# Patient Record
Sex: Female | Born: 1935 | Race: White | Hispanic: No | Smoking: Never smoker
Health system: Southern US, Community
[De-identification: ages and names within clinical notes are randomized; demographics above are authoritative.]

## PROBLEM LIST (undated history)

## (undated) DIAGNOSIS — F32A Depression, unspecified: Secondary | ICD-10-CM

## (undated) DIAGNOSIS — K219 Gastro-esophageal reflux disease without esophagitis: Secondary | ICD-10-CM

## (undated) DIAGNOSIS — Z950 Presence of cardiac pacemaker: Secondary | ICD-10-CM

## (undated) DIAGNOSIS — E079 Disorder of thyroid, unspecified: Secondary | ICD-10-CM

## (undated) DIAGNOSIS — I10 Essential (primary) hypertension: Secondary | ICD-10-CM

## (undated) DIAGNOSIS — F329 Major depressive disorder, single episode, unspecified: Secondary | ICD-10-CM

## (undated) DIAGNOSIS — I2510 Atherosclerotic heart disease of native coronary artery without angina pectoris: Secondary | ICD-10-CM

## (undated) HISTORY — DX: Essential (primary) hypertension: I10

## (undated) HISTORY — DX: Major depressive disorder, single episode, unspecified: F32.9

## (undated) HISTORY — DX: Presence of cardiac pacemaker: Z95.0

## (undated) HISTORY — DX: Disorder of thyroid, unspecified: E07.9

## (undated) HISTORY — DX: Atherosclerotic heart disease of native coronary artery without angina pectoris: I25.10

## (undated) HISTORY — DX: Depression, unspecified: F32.A

## (undated) HISTORY — DX: Gastro-esophageal reflux disease without esophagitis: K21.9

---
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
## 1958-11-27 HISTORY — PX: ABDOMINAL HYSTERECTOMY: SHX81

## 1994-03-28 HISTORY — PX: CHOLECYSTECTOMY: SHX55

## 1996-03-28 DIAGNOSIS — I2510 Atherosclerotic heart disease of native coronary artery without angina pectoris: Secondary | ICD-10-CM

## 1996-03-28 HISTORY — DX: Atherosclerotic heart disease of native coronary artery without angina pectoris: I25.10

## 1996-03-28 HISTORY — PX: PACEMAKER PLACEMENT: SHX43

## 1996-03-28 HISTORY — PX: OTHER SURGICAL HISTORY: SHX169

## 2004-01-15 ENCOUNTER — Emergency Department: Payer: Self-pay | Admitting: Emergency Medicine

## 2004-01-23 ENCOUNTER — Ambulatory Visit: Payer: Self-pay | Admitting: Internal Medicine

## 2004-02-06 ENCOUNTER — Ambulatory Visit: Payer: Self-pay | Admitting: Internal Medicine

## 2004-02-25 ENCOUNTER — Ambulatory Visit: Payer: Self-pay | Admitting: Neurology

## 2004-12-05 ENCOUNTER — Emergency Department: Payer: Self-pay | Admitting: Unknown Physician Specialty

## 2005-02-04 ENCOUNTER — Ambulatory Visit: Payer: Self-pay | Admitting: Internal Medicine

## 2005-03-02 ENCOUNTER — Ambulatory Visit: Payer: Self-pay | Admitting: Internal Medicine

## 2005-03-02 DIAGNOSIS — K219 Gastro-esophageal reflux disease without esophagitis: Secondary | ICD-10-CM

## 2005-04-14 ENCOUNTER — Ambulatory Visit: Payer: Self-pay | Admitting: *Deleted

## 2005-04-28 HISTORY — PX: US ECHOCARDIOGRAPHY: HXRAD669

## 2005-05-04 ENCOUNTER — Ambulatory Visit: Payer: Self-pay | Admitting: Internal Medicine

## 2005-05-12 ENCOUNTER — Encounter: Payer: Self-pay | Admitting: Internal Medicine

## 2005-05-26 ENCOUNTER — Ambulatory Visit: Payer: Self-pay | Admitting: *Deleted

## 2005-06-01 ENCOUNTER — Ambulatory Visit: Payer: Self-pay | Admitting: *Deleted

## 2005-06-07 ENCOUNTER — Ambulatory Visit: Payer: Self-pay | Admitting: Internal Medicine

## 2005-08-11 ENCOUNTER — Ambulatory Visit: Payer: Self-pay | Admitting: *Deleted

## 2005-08-25 ENCOUNTER — Ambulatory Visit: Payer: Self-pay | Admitting: *Deleted

## 2005-10-13 ENCOUNTER — Ambulatory Visit: Payer: Self-pay | Admitting: *Deleted

## 2005-12-15 ENCOUNTER — Ambulatory Visit: Payer: Self-pay | Admitting: *Deleted

## 2006-01-06 ENCOUNTER — Ambulatory Visit: Payer: Self-pay | Admitting: *Deleted

## 2006-02-07 ENCOUNTER — Ambulatory Visit: Payer: Self-pay | Admitting: Ophthalmology

## 2006-02-10 ENCOUNTER — Ambulatory Visit: Payer: Self-pay | Admitting: Internal Medicine

## 2006-02-14 ENCOUNTER — Ambulatory Visit: Payer: Self-pay | Admitting: Ophthalmology

## 2006-02-14 HISTORY — PX: CATARACT EXTRACTION: SUR2

## 2006-02-15 ENCOUNTER — Ambulatory Visit: Payer: Self-pay | Admitting: *Deleted

## 2006-04-17 ENCOUNTER — Ambulatory Visit: Payer: Self-pay | Admitting: *Deleted

## 2006-06-15 ENCOUNTER — Ambulatory Visit: Payer: Self-pay | Admitting: *Deleted

## 2006-07-05 ENCOUNTER — Encounter: Payer: Self-pay | Admitting: *Deleted

## 2006-07-05 DIAGNOSIS — I2510 Atherosclerotic heart disease of native coronary artery without angina pectoris: Secondary | ICD-10-CM | POA: Insufficient documentation

## 2006-07-05 DIAGNOSIS — F329 Major depressive disorder, single episode, unspecified: Secondary | ICD-10-CM

## 2006-07-05 DIAGNOSIS — D649 Anemia, unspecified: Secondary | ICD-10-CM

## 2006-07-05 DIAGNOSIS — E0590 Thyrotoxicosis, unspecified without thyrotoxic crisis or storm: Secondary | ICD-10-CM | POA: Insufficient documentation

## 2006-07-05 DIAGNOSIS — E119 Type 2 diabetes mellitus without complications: Secondary | ICD-10-CM

## 2006-07-05 DIAGNOSIS — I10 Essential (primary) hypertension: Secondary | ICD-10-CM | POA: Insufficient documentation

## 2006-07-05 DIAGNOSIS — M810 Age-related osteoporosis without current pathological fracture: Secondary | ICD-10-CM | POA: Insufficient documentation

## 2006-08-10 ENCOUNTER — Ambulatory Visit: Payer: Self-pay | Admitting: *Deleted

## 2006-08-11 ENCOUNTER — Encounter (INDEPENDENT_AMBULATORY_CARE_PROVIDER_SITE_OTHER): Payer: Self-pay | Admitting: *Deleted

## 2006-08-11 DIAGNOSIS — H04129 Dry eye syndrome of unspecified lacrimal gland: Secondary | ICD-10-CM | POA: Insufficient documentation

## 2006-10-13 ENCOUNTER — Ambulatory Visit: Payer: Self-pay | Admitting: *Deleted

## 2006-11-10 ENCOUNTER — Encounter (INDEPENDENT_AMBULATORY_CARE_PROVIDER_SITE_OTHER): Payer: Self-pay | Admitting: *Deleted

## 2006-11-16 ENCOUNTER — Encounter: Payer: Self-pay | Admitting: Internal Medicine

## 2006-11-22 ENCOUNTER — Telehealth (INDEPENDENT_AMBULATORY_CARE_PROVIDER_SITE_OTHER): Payer: Self-pay | Admitting: *Deleted

## 2006-11-23 ENCOUNTER — Ambulatory Visit: Payer: Self-pay | Admitting: *Deleted

## 2006-12-06 ENCOUNTER — Ambulatory Visit: Payer: Self-pay | Admitting: Internal Medicine

## 2006-12-06 DIAGNOSIS — J019 Acute sinusitis, unspecified: Secondary | ICD-10-CM

## 2006-12-12 ENCOUNTER — Encounter (INDEPENDENT_AMBULATORY_CARE_PROVIDER_SITE_OTHER): Payer: Self-pay | Admitting: *Deleted

## 2006-12-19 ENCOUNTER — Encounter (INDEPENDENT_AMBULATORY_CARE_PROVIDER_SITE_OTHER): Payer: Self-pay | Admitting: *Deleted

## 2007-01-19 ENCOUNTER — Encounter: Payer: Self-pay | Admitting: Internal Medicine

## 2007-02-27 ENCOUNTER — Encounter (INDEPENDENT_AMBULATORY_CARE_PROVIDER_SITE_OTHER): Payer: Self-pay | Admitting: *Deleted

## 2007-04-13 ENCOUNTER — Encounter (INDEPENDENT_AMBULATORY_CARE_PROVIDER_SITE_OTHER): Payer: Self-pay | Admitting: *Deleted

## 2007-04-13 ENCOUNTER — Ambulatory Visit: Payer: Self-pay | Admitting: *Deleted

## 2007-04-13 ENCOUNTER — Encounter: Payer: Self-pay | Admitting: *Deleted

## 2007-04-13 DIAGNOSIS — G4700 Insomnia, unspecified: Secondary | ICD-10-CM

## 2007-04-18 ENCOUNTER — Encounter (INDEPENDENT_AMBULATORY_CARE_PROVIDER_SITE_OTHER): Payer: Self-pay | Admitting: *Deleted

## 2007-04-30 ENCOUNTER — Ambulatory Visit: Payer: Self-pay | Admitting: Internal Medicine

## 2007-05-10 ENCOUNTER — Ambulatory Visit: Payer: Self-pay | Admitting: *Deleted

## 2007-06-25 ENCOUNTER — Encounter (INDEPENDENT_AMBULATORY_CARE_PROVIDER_SITE_OTHER): Payer: Self-pay | Admitting: *Deleted

## 2007-08-03 ENCOUNTER — Encounter (INDEPENDENT_AMBULATORY_CARE_PROVIDER_SITE_OTHER): Payer: Self-pay | Admitting: *Deleted

## 2007-08-07 ENCOUNTER — Encounter: Payer: Self-pay | Admitting: Internal Medicine

## 2007-08-10 ENCOUNTER — Ambulatory Visit: Payer: Self-pay | Admitting: *Deleted

## 2007-10-17 ENCOUNTER — Encounter: Payer: Self-pay | Admitting: Internal Medicine

## 2007-12-13 ENCOUNTER — Telehealth: Payer: Self-pay | Admitting: Internal Medicine

## 2007-12-13 ENCOUNTER — Encounter: Payer: Self-pay | Admitting: Internal Medicine

## 2007-12-13 ENCOUNTER — Ambulatory Visit: Payer: Self-pay | Admitting: Internal Medicine

## 2007-12-13 DIAGNOSIS — J309 Allergic rhinitis, unspecified: Secondary | ICD-10-CM | POA: Insufficient documentation

## 2007-12-19 ENCOUNTER — Encounter: Payer: Self-pay | Admitting: Internal Medicine

## 2008-01-14 ENCOUNTER — Ambulatory Visit: Payer: Self-pay | Admitting: Internal Medicine

## 2008-01-24 ENCOUNTER — Encounter: Payer: Self-pay | Admitting: Internal Medicine

## 2008-02-18 ENCOUNTER — Ambulatory Visit: Payer: Self-pay | Admitting: Internal Medicine

## 2008-02-22 ENCOUNTER — Ambulatory Visit: Payer: Self-pay | Admitting: Family Medicine

## 2008-02-25 ENCOUNTER — Telehealth (INDEPENDENT_AMBULATORY_CARE_PROVIDER_SITE_OTHER): Payer: Self-pay | Admitting: *Deleted

## 2008-02-28 ENCOUNTER — Telehealth: Payer: Self-pay | Admitting: Internal Medicine

## 2008-03-24 ENCOUNTER — Encounter: Payer: Self-pay | Admitting: Internal Medicine

## 2008-04-24 ENCOUNTER — Encounter: Payer: Self-pay | Admitting: Internal Medicine

## 2008-04-24 ENCOUNTER — Ambulatory Visit: Payer: Self-pay | Admitting: Internal Medicine

## 2008-05-30 ENCOUNTER — Encounter: Payer: Self-pay | Admitting: Internal Medicine

## 2008-06-13 ENCOUNTER — Encounter: Payer: Self-pay | Admitting: Internal Medicine

## 2008-06-19 ENCOUNTER — Telehealth: Payer: Self-pay | Admitting: Internal Medicine

## 2008-07-04 ENCOUNTER — Encounter: Payer: Self-pay | Admitting: Internal Medicine

## 2008-07-14 ENCOUNTER — Telehealth: Payer: Self-pay | Admitting: Internal Medicine

## 2008-07-21 ENCOUNTER — Ambulatory Visit: Payer: Self-pay | Admitting: Internal Medicine

## 2008-07-21 DIAGNOSIS — L02419 Cutaneous abscess of limb, unspecified: Secondary | ICD-10-CM

## 2008-07-21 DIAGNOSIS — L03119 Cellulitis of unspecified part of limb: Secondary | ICD-10-CM

## 2008-08-11 ENCOUNTER — Telehealth: Payer: Self-pay | Admitting: Internal Medicine

## 2008-08-12 ENCOUNTER — Ambulatory Visit: Payer: Self-pay | Admitting: Family Medicine

## 2008-08-15 ENCOUNTER — Telehealth (INDEPENDENT_AMBULATORY_CARE_PROVIDER_SITE_OTHER): Payer: Self-pay | Admitting: *Deleted

## 2008-09-03 ENCOUNTER — Encounter: Payer: Self-pay | Admitting: Internal Medicine

## 2008-09-03 ENCOUNTER — Ambulatory Visit: Payer: Self-pay | Admitting: Internal Medicine

## 2008-09-22 ENCOUNTER — Encounter: Payer: Self-pay | Admitting: Family Medicine

## 2009-02-13 ENCOUNTER — Telehealth: Payer: Self-pay | Admitting: Internal Medicine

## 2009-10-16 ENCOUNTER — Encounter (INDEPENDENT_AMBULATORY_CARE_PROVIDER_SITE_OTHER): Payer: Self-pay | Admitting: *Deleted

## 2009-11-16 ENCOUNTER — Encounter (INDEPENDENT_AMBULATORY_CARE_PROVIDER_SITE_OTHER): Payer: Self-pay | Admitting: *Deleted

## 2009-12-24 ENCOUNTER — Encounter: Payer: Self-pay | Admitting: Internal Medicine

## 2010-01-12 ENCOUNTER — Ambulatory Visit: Payer: Self-pay | Admitting: Internal Medicine

## 2010-01-12 DIAGNOSIS — Z950 Presence of cardiac pacemaker: Secondary | ICD-10-CM

## 2010-01-25 ENCOUNTER — Encounter: Payer: Self-pay | Admitting: Internal Medicine

## 2010-01-26 ENCOUNTER — Ambulatory Visit: Payer: Self-pay | Admitting: Internal Medicine

## 2010-01-26 ENCOUNTER — Ambulatory Visit (HOSPITAL_COMMUNITY): Admission: RE | Admit: 2010-01-26 | Discharge: 2010-01-26 | Payer: Self-pay | Admitting: Internal Medicine

## 2010-01-26 DIAGNOSIS — Z950 Presence of cardiac pacemaker: Secondary | ICD-10-CM

## 2010-01-26 HISTORY — PX: INSERT / REPLACE / REMOVE PACEMAKER: SUR710

## 2010-01-26 HISTORY — DX: Presence of cardiac pacemaker: Z95.0

## 2010-02-04 ENCOUNTER — Encounter: Payer: Self-pay | Admitting: Internal Medicine

## 2010-02-12 ENCOUNTER — Encounter (INDEPENDENT_AMBULATORY_CARE_PROVIDER_SITE_OTHER): Payer: Self-pay | Admitting: *Deleted

## 2010-04-19 ENCOUNTER — Ambulatory Visit: Admit: 2010-04-19 | Payer: Self-pay | Admitting: Internal Medicine

## 2010-04-27 ENCOUNTER — Encounter: Payer: Self-pay | Admitting: Internal Medicine

## 2010-04-27 ENCOUNTER — Ambulatory Visit: Admit: 2010-04-27 | Payer: Self-pay | Admitting: Internal Medicine

## 2010-04-27 NOTE — Miscellaneous (Signed)
Summary: Device change out  Clinical Lists Changes  Observations: Added new observation of PPMLEADSTAT2: active (02/04/2010 7:12) Added new observation of PPMLEADSTAT1: active (02/04/2010 7:12) Added new observation of PPM IMP MD: Lewayne Bunting, MD (02/04/2010 7:12) Added new observation of PPM DOI: 01/26/2010 (02/04/2010 7:12) Added new observation of PPM SERL#: [id number] (02/04/2010 7:12) Added new observation of PPM MODL#: [id number] (02/04/2010 7:12) Added new observation of MAGNET RTE: BOL 85 ERI 65 (02/04/2010 7:12) Added new observation of PPMEXPLCOMM: 01/26/10 Medtronic 7862 [id number] H explanted (02/04/2010 7:12)      PPM Specifications Following MD:  Sherryl Manges, MD     PPM Vendor:  Medtronic     PPM Model Number:  [id number]     PPM Serial Number:  [id number] PPM DOI:  01/26/2010     PPM Implanting MD:  Lewayne Bunting, MD  Lead 1    Location: RA     DOI: 05/01/1996     Model #: [id number]     Serial #: [id number]     Status: active Lead 2    Location: RV     DOI: 05/01/1996     Model #: [id number]     Serial #: [id number]     Status: active  Magnet Response Rate:  BOL 85 ERI 65  Indications:  COMPLETE HEART BLOCK  Explantation Comments:  01/26/10 Medtronic 7862 [id number] H explanted  PPM Follow Up Pacer Dependent:  No      Episodes Coumadin:  No  Parameters Mode:  DDD     Lower Rate Limit:  50     Upper Rate Limit:  110 Paced AV Delay:  220     Sensed AV Delay:  220

## 2010-04-27 NOTE — Assessment & Plan Note (Signed)
Summary: ROV/ALT      Allergies Added: NKDA  Visit Type:  Follow-up Primary Provider:  Jarold Motto  CC:  c/o a rash and swelling around the pacemaker.Marland Kitchen  History of Present Illness: Joanna Young returns today after a long absence from our PPM clinic.  She is a pleasant elderly woman with a h/o bradycardia who is s/p PPM in 1998.  She has done well and denies syncope or peripheral edema.  No c/p or sob.  She has a h/o moderate LV dysfunction but no significant CHF symptoms.  She c/o a rash around her PPM. No fever/chills/night sweats.  Current Medications (verified): 1)  Prilosec 20 Mg  Cpdr (Omeprazole) .... Take One Daily Before Breakfast 2)  Synthroid 75 Mcg  Tabs (Levothyroxine Sodium) .... Take One Tab Daily. 3)  Aspirin Ec 325 Mg  Tbec (Aspirin) .... Take One By Mouth Once A Day 4)  Zoloft 100 Mg  Tabs (Sertraline Hcl) .... Take 1/2  By Mouth Once A Day 5)  Loratadine 10 Mg  Tabs (Loratadine) .... Take One By Mouth Once A Day As Needed 6)  Teargen Ii 0.4 %  Soln (Hypromellose) .... Instill 2 Drops in Each Eye 4 X / Day and As Needed. 7)  Promethazine Hcl 12.5 Mg  Tabs (Promethazine Hcl) .... Take 1 Tab By Mouth Every 4 Hours As Needed Nausea/vomiting. 8)  Metformin Hcl 500 Mg  Tabs (Metformin Hcl) .... Take 1 Tab Twice A Day With Meals. 9)  Vitamin D [address] Unit Caps (Ergocalciferol) .Marland Kitchen.. 1 Monthly 10)  Artificial Tears 1.4 % Soln (Polyvinyl Alcohol) .... Instill 2 Drops in Each Eye 4 Times Daily  Allergies (verified): No Known Drug Allergies  Past History:  Past Medical History: Last updated: 12/13/2007 Coronary artery disease 1/98 Depression Diabetes mellitus, type II GERD  Hypertension Hyperthyroidism Osteoporosis Allergic rhinitis  Past Surgical History: Last updated: 07/05/2006 Cholecystectomy 1996 Hysterectomy 1960's Permanent pacemaker- implanted 1998 Tonsillectomy 1944 Vaginal deliveries x 6 IWMI 1/98 ECHO- EF 45% -diast. dys. Fr. also 2/07 Cataract extraction  OS- 02/14/2006  Social History: Last updated: 02/27/2009 Husband in prison 6 children--1 died Never Smoked Alcohol use-no Regular Exercise - yes Drug Use - no Retired  Disabled   Risk Factors: Exercise: yes (02/27/2009)  Risk Factors: Smoking Status: never (12/13/2007)  Review of Systems  The patient denies chest pain, syncope, dyspnea on exertion, and peripheral edema.    Vital Signs:  Patient profile:   77 year old female Height:      60.75 inches Weight:      127 pounds BMI:     24.28 Pulse rate:   68 / minute BP sitting:   140 / 78  (left arm) Cuff size:   regular  Vitals Entered By: Bishop Dublin, CMA (January 12, 2010 10:39 AM)  Physical Exam  General:  alert and normal appearance.   Head:  no maxillary sinus ttp Eyes:  PERRLA. No errythema noted. Mouth:  pharyngeal erythema, no exudate Neck:  supple, no masses, no thyromegaly, no carotid bruits, and no cervical lymphadenopathy.   Chest Wall:  well healed PPM incsision. I do not note a rash. Lungs:  normal respiratory effort and normal breath sounds.   Heart:  normal rate, regular rhythm, no murmur, and no gallop.   Abdomen:  soft and non-tender.   Msk:  no joint tenderness and no joint swelling.   Pulses:  1+ in feet Extremities:  no edema Neurologic:  alert & oriented X3.  No focal weakness  PPM Specifications Following MD:  Sherryl Manges, MD     PPM Vendor:  Medtronic     PPM Model Number:  [id number]     PPM Serial Number:  [id number] PPM DOI:  05/01/1996     PPM Implanting MD:  Sherryl Manges, MD  Lead 1    Location: RA     DOI: 05/01/1996     Model #: 7829     Serial #: [id number]     Status: active Lead 2    Location: RV     DOI: 05/01/1996     Model #: 7846     Serial #: [id number]     Status: active   Indications:  COMPLETE HEART BLOCK   PPM Follow Up Remote Check?  No Battery Voltage:  2.68 V     Battery Est. Longevity:  1 month     Pacer Dependent:  No       PPM Device Measurements Atrium   Amplitude: 0.1 mV, Impedance: 141 ohms, Threshold: 1.0 V at 0.8 msec Right Ventricle  Amplitude: 5.6 mV, Impedance: 615 ohms, Threshold: 1.5 V at 0.5 msec  Episodes MS Episodes:  0     Percent Mode Switch:  0     Coumadin:  No Atrial Pacing:  10%     Ventricular Pacing:  9%  Parameters Mode:  DDD     Lower Rate Limit:  50     Upper Rate Limit:  110 Paced AV Delay:  220     Sensed AV Delay:  220 Tech Comments:  Battery near ERI.   No parameter changes.   Altha Harm, LPN  January 12, 2010 10:53 AM  MD Comments:  Agree with above.  Impression & Recommendations:  Problem # 1:  CARDIAC PACEMAKER IN SITU (ICD-V45.01) Her device is one month away from replacement. I have recommended we schedule her to come in for generator change.   Problem # 2:  HEART BLOCK/ COMPLETE 1/98 (ICD-426.9) Today she is not dependent.  Will follow. Her updated medication list for this problem includes:    Aspirin Ec 325 Mg Tbec (Aspirin) .Marland Kitchen... Take one by mouth once a day  Problem # 3:  HYPERTENSION (ICD-401.9) Her blood pressure is mildly elevated today.  I have asked her to reduce her sodium intake. Her updated medication list for this problem includes:    Aspirin Ec 325 Mg Tbec (Aspirin) .Marland Kitchen... Take one by mouth once a day  Appended Document: Orders Update    Clinical Lists Changes  Problems: Added new problem of PRE-OPERATIVE CARDIOVASCULAR EXAMINATION (ICD-V72.81) Orders: Added new Test order of T-Basic Metabolic Panel [phone]) - Signed Added new Test order of T-CBC w/Diff [phone]) - Signed Added new Test order of T-PTT ([id number]) - Signed Added new Test order of T-Protime, Auto ([id number]) - Signed

## 2010-04-27 NOTE — Letter (Signed)
Summary: Device-Delinquent Check  [facility], Main Office  [address]   [place], [place] [address]   Phone: [phone]  Fax: [phone]     November 16, 2009 MRN: [id number]   Joanna Young [address] [place], [place]  [address]   Dear Ms. BRAZZEL,  According to our records, you have not had your implanted device checked in the recommended period of time.  We are unable to determine appropriate device function without checking your device on a regular basis.  Please call our office to schedule an appointment, with Dr. Graciela Husbands in [place],  as soon as possible.  If you are having your device checked by another physician, please call us so that we may update our records.  Thank you,  Altha Harm, LPN  November 16, 2009 8:57 AM  [facility]  certified mail

## 2010-04-27 NOTE — Letter (Signed)
Summary: Device-Delinquent Check  [facility], Main Office  [address]   [place], [place] [address]   Phone: [phone]  Fax: [phone]     October 16, 2009 MRN: [id number]   Joanna Young [address] [place], [place]  [address]   Dear Ms. SNELL,  According to our records, you have not had your implanted device checked in the recommended period of time, which was October 2010.  We are unable to determine appropriate device function without checking your device on a regular basis.  Please call our office to schedule an appointment as soon as possible.  If you are having your device checked by another physician, please call us so that we may update our records.  Thank you,   Architectural technologist [facility]

## 2010-04-27 NOTE — Letter (Signed)
Summary: Appointment - Missed  [facility], Main Office  [address]   [place], [place] [address]   Phone: [phone]  Fax: [phone]     February 12, 2010 MRN: [id number]   Joanna Young [address] [place], [place]  [address]   Dear [name],  Our records indicate you missed your appointment on 02-10-10  with the [facility] for your wound check after your surgical procedure.  It is very important that we reach you to reschedule this appointment. We look forward to participating in your health care needs. Please contact us at the number listed above at your earliest convenience to reschedule this appointment.     Sincerely,    Glass blower/designer

## 2010-04-27 NOTE — Cardiovascular Report (Signed)
Summary: Certified Letter Delivered - not doing follow up  Certified Letter Delivered - not doing follow up   Imported By: Debby Freiberg 01/25/2010 14:56:17  _____________________________________________________________________  External Attachment:    Type:   Image     Comment:   External Document

## 2010-04-27 NOTE — Cardiovascular Report (Signed)
Summary: Pre Op Orders   Pre Op Orders   Imported By: Roderic Ovens 02/15/2010 16:46:42  _____________________________________________________________________  External Attachment:    Type:   Image     Comment:   External Document

## 2010-04-28 ENCOUNTER — Encounter: Payer: Self-pay | Admitting: Internal Medicine

## 2010-04-28 ENCOUNTER — Encounter (INDEPENDENT_AMBULATORY_CARE_PROVIDER_SITE_OTHER): Payer: Medicare Other | Admitting: Internal Medicine

## 2010-04-28 DIAGNOSIS — I498 Other specified cardiac arrhythmias: Secondary | ICD-10-CM

## 2010-04-28 DIAGNOSIS — I10 Essential (primary) hypertension: Secondary | ICD-10-CM

## 2010-04-28 DIAGNOSIS — E782 Mixed hyperlipidemia: Secondary | ICD-10-CM

## 2010-05-05 NOTE — Assessment & Plan Note (Signed)
Summary: pc2   Vital Signs:  Patient profile:   77 year old female Height:      60.75 inches Weight:      127 pounds BMI:     24.28 Pulse rate:   76 / minute BP sitting:   180 / 82  (right arm)  Vitals Entered By: Laurance Flatten CMA (April 28, 2010 2:20 PM)  Primary Provider:  Jarold Motto   History of Present Illness: Joanna Young returns today for PPM followup.  She is a pleasant elderly woman with a h/o HTN, symptomatic bradycardia and is s/p PPM.  She denies c/p, sob, or peripheral edema.  Current Medications (verified): 1)  Prilosec 20 Mg  Cpdr (Omeprazole) .... Take One Daily Before Breakfast 2)  Synthroid 88 Mcg Tabs (Levothyroxine Sodium) .... Once Daily 3)  Aspirin Ec 325 Mg  Tbec (Aspirin) .... Take One By Mouth Once A Day 4)  Zoloft 100 Mg  Tabs (Sertraline Hcl) .... Take 1/2  By Mouth Once A Day 5)  Loratadine 10 Mg  Tabs (Loratadine) .... Take One By Mouth Once A Day As Needed 6)  Teargen Ii 0.4 %  Soln (Hypromellose) .... Instill 2 Drops in Each Eye 4 X / Day and As Needed. 7)  Promethazine Hcl 12.5 Mg  Tabs (Promethazine Hcl) .... Take 1 Tab By Mouth Every 4 Hours As Needed Nausea/vomiting. 8)  Metformin Hcl 500 Mg  Tabs (Metformin Hcl) .... Take 1 Tab Twice A Day With Meals. 9)  Vitamin D 400 Unit Caps (Cholecalciferol) .... 2 Tablets Daily 10)  Artificial Tears 1.4 % Soln (Polyvinyl Alcohol) .... Instill 2 Drops in Each Eye 4 Times Daily 11)  Losartan Potassium 50 Mg Tabs (Losartan Potassium) .... Once Daily 12)  Simvastatin 10 Mg Tabs (Simvastatin) .... Take One Tablet By Mouth Daily At Bedtime 13)  Ventolin Hfa 108 (90 Base) Mcg/act Aers (Albuterol Sulfate) .... Uad 14)  Hydromet 5-1.5 Mg/32ml Syrp (Hydrocodone-Homatropine) .... As Needed  Allergies (verified): No Known Drug Allergies  Past History:  Past Medical History: Last updated: 12/13/2007 Coronary artery disease 1/98 Depression Diabetes mellitus, type II GERD   Hypertension Hyperthyroidism Osteoporosis Allergic rhinitis  Past Surgical History: Last updated: 07/05/2006 Cholecystectomy 1996 Hysterectomy 1960's Permanent pacemaker- implanted 1998 Tonsillectomy 1944 Vaginal deliveries x 6 IWMI 1/98 ECHO- EF 45% -diast. dys. Fr. also 2/07 Cataract extraction OS- 02/14/2006  Review of Systems  The patient denies chest pain, syncope, dyspnea on exertion, and peripheral edema.    Physical Exam  General:  alert and normal appearance.   Head:  no maxillary sinus ttp Mouth:  pharyngeal erythema, no exudate Neck:  supple, no masses, no thyromegaly, no carotid bruits, and no cervical lymphadenopathy.   Chest Wall:  well healed PPM incsision. I do not note a rash. Lungs:  normal respiratory effort and normal breath sounds.   Heart:  normal rate, regular rhythm, no murmur, and no gallop.   Abdomen:  soft and non-tender.   Msk:  no joint tenderness and no joint swelling.   Pulses:  1+ in feet Extremities:  no edema Neurologic:  alert & oriented X3.  No focal weakness   Impression & Recommendations:  Problem # 1:  CARDIAC PACEMAKER IN SITU (ICD-V45.01) Her device is working normally. Will recheck in several months.  Problem # 2:  HYPERTENSION (ICD-401.9) Her blood pressure is elevated today but on my recheck it was 135/55. Her updated medication list for this problem includes:    Aspirin Ec 325 Mg  Tbec (Aspirin) .Marland Kitchen... Take one by mouth once a day    Losartan Potassium 50 Mg Tabs (Losartan potassium) ..... Once daily  Patient Instructions: 1)  Your physician wants you to follow-up in: Nov with Dr Court Joy will receive a reminder letter in the mail two months in advance. If you don't receive a letter, please call our office to schedule the follow-up appointment.      PPM Specifications Following MD:  Sherryl Manges, MD     PPM Vendor:  Medtronic     PPM Model Number:  [id number]     PPM Serial Number:  [id number] PPM DOI:  01/26/2010      PPM Implanting MD:  Lewayne Bunting, MD  Lead 1    Location: RA     DOI: 05/01/1996     Model #: [id number]     Serial #: [id number]     Status: active Lead 2    Location: RV     DOI: 05/01/1996     Model #: 4024     Serial #: [id number]     Status: active  Magnet Response Rate:  BOL 85 ERI 65  Indications:  COMPLETE HEART BLOCK  Explantation Comments:  01/26/10 Medtronic 7862 Prodigy/[id number] H explanted  PPM Follow Up Battery Voltage:  2.79 V     Battery Est. Longevity:  12.5 yrs     Pacer Dependent:  No       PPM Device Measurements Atrium  Amplitude: 0.70 mV, Impedance: 259 ohms, Threshold: 1.50 V at 0.40 msec Right Ventricle  Amplitude: 11.2 mV, Impedance: 495 ohms, Threshold: 1.00 V at 0.40 msec  Episodes MS Episodes:  7162     Percent Mode Switch:  6.4%     Coumadin:  No Ventricular High Rate:  2     Atrial Pacing:  1.6%     Ventricular Pacing:  1.0%  Parameters Mode:  DDD     Lower Rate Limit:  50     Upper Rate Limit:  110 Paced AV Delay:  220     Sensed AV Delay:  220 Next Cardiology Appt Due:  01/27/2011 Tech Comments:  7162 MODE SWITCHES--FARFIELD.  NORMAL DEVICE FUNCTION.  NO CHANGES MADE. ROV IN NOV W/GT. Vella Kohler  April 28, 2010 2:37 PM

## 2010-05-13 NOTE — Cardiovascular Report (Signed)
Summary: Office Visit   Office Visit   Imported By: Roderic Ovens 05/07/2010 11:05:50  _____________________________________________________________________  External Attachment:    Type:   Image     Comment:   External Document

## 2010-06-08 LAB — SURGICAL PCR SCREEN: MRSA, PCR: NEGATIVE

## 2010-06-08 LAB — GLUCOSE, CAPILLARY: Glucose-Capillary: 130 mg/dL — ABNORMAL HIGH (ref 70–99)

## 2010-06-15 NOTE — Letter (Signed)
Summary: Medication Administration Record  Medication Administration Record   Imported By: Marylou Mccoy 06/07/2010 15:10:18  _____________________________________________________________________  External Attachment:    Type:   Image     Comment:   External Document

## 2010-07-08 ENCOUNTER — Emergency Department: Payer: Medicare Other | Admitting: Emergency Medicine

## 2010-08-10 NOTE — Letter (Signed)
April 30, 2007    Concha Pyo, NP  [address] [place], [place] [address]   RE:  Joanna Young, Joanna Young  MRN:  [id number]  /  DOB:  [birth year]   Dear Carleene Mains:   It was a pleasure to meet Larrie Kass today to establish the patient here  for followup.  She had a pacemaker implanted about 10 years ago for  apparently complete heart block in the state of a myocardial infarction.  We do not have any intervening history at all and when I told her she  has not used her pacemaker much, she said I know.   She has some problems with tachy palpitations and some problems with  peripheral edema.   Her cardiac evaluation, as best as we know, includes an echo from 2007  demonstrating EF of 45% with some diastolic dysfunction.   Her past medical history in addition to above, is notable for diabetes,  hypertension, treated hypothyroidism, and coronary artery disease as  suggested by the myocardial infarction in 1996.  More recent evaluation  has not been done.   In addition her review of systems is notable for both thyroidism and GU  reflux disease.   Her past surgical history is notable for cholecystectomy, hysterectomy,  tonsillectomy.   Her medications currently occlude Avandia 2, metformin 500 b.i.d.,  aspirin, Synthroid 75 mcg, Prinivil 10, Claritin, Zoloft 100, Prilosec,  Zocor.  She has no known drug allergies.   Social  history, she is widowed.  She lives down here in [place],  having moved down about 5 years ago to be with her daughter.  She does  not use cigarettes, alcohol, or recreational drugs.  She walks with a  walker.   On examination today she is an elderly, Caucasian female appearing her  stated age of 12.  She has a generalized tremor.  Her blood pressure is  139/81.  Her weight was 150 and her pulse was 102.  Her HEENT exam  revealed no xanthoma.  Her capillary refill was good.  Her neck veins  were flat.  Her carotids were brisk.  Her back was without kyphosis  or  scoliosis. Lungs were clear.  Heart sounds were regular.  The pacemaker  pocket was well-healed.  The abdomen was soft, normal.  Pulses were 2+.  Distal pulses were intact.  There was no clubbing or cyanosis.  Neurological exam was grossly normal, except as above, and difficulty  with hearing.  The skin was warm and dry.   Most recent TSH was mildly elevated at 7.5.  We do not have a recent  blood count to explain the tachycardia.   Interrogation of her Medtronic Prodigy pulse generator demonstrates a P-  wave of 0.5 to 0.7 with an impedance of 257, a threshold of 1.5 and 0.5,  both chambers R-wave of 8 with impedance of 645. Voltage is 2.74.  She  does not pace hardly in either chamber, with heart rate excursions  really quite surprisingly good.  Estimated longevity was 21 months.   IMPRESSION:  1. Bradycardia and apparently complete heart block in the context of      an inferior wall myocardial infarction.  2. Status post pacer for the above.  3. Coronary artery disease with prior myocardial infarction by      history, with ejection fraction of 45% and diastolic dysfunction by      echo.  4. Diabetes.  5. Obesity.   Mrs. Fleagle's pacemaker, Carleene Mains, is working  quite well.  Will plan to see  her again in 6 months.   I was bothered by her resting tachycardia.  I saw from your notes that  the thyroid replacement was low, if anything, with a TSH of 7.5; I  assume also that you have access to a hemoglobin.  I think keeping an  eye on her heart rate would be important.   I will plan to see her again in 6  months time.  Please let us know if  there if anything we can do in the interim.    Sincerely,      Duke Salvia, MD, [facility]  Electronically Signed    SCK/MedQ  DD: 04/30/2007  DT: 05/01/2007  Job #: [id number]   CC:    Karie Schwalbe, MD

## 2010-08-10 NOTE — Assessment & Plan Note (Signed)
[facility]                         ELECTROPHYSIOLOGY OFFICE NOTE   NAME:Young, Joanna                             MRN:          [id number]  DATE:01/14/2008                            DOB:          1924/12/20    Joanna Young returns today for followup.  She is very pleasant elderly woman  with a history of symptomatic bradycardia following myocardial  infarction approximately 10 years ago.  She has a history of heart block  secondary to this, now with return of her normal conduction.  She has a  history of diabetes and hypertension.  The patient returns today for  followup.  She denies chest pain.  She denies shortness of breath.  She  has had no syncopal episodes.  She notes that occasionally her right  foot swells, though it is not swollen all the day.  She has had no  syncope.  She occasionally notes some discomfort at the side of her  pacemaker.   CURRENT MEDICATIONS:  1. Avandia 2 mg a day.  2. Metformin 500 mg twice daily.  3. Aspirin 325 a day.  4. Flonase 0.05% nasal spray daily.  5. Synthroid 75 mcg daily.  6. Prinivil 10 mg daily.  7. Zoloft 100 mg daily.  8. Prilosec 20 once a day.  9. Zocor 20 a day.   PHYSICAL EXAMINATION:  GENERAL:  She is a pleasant, well-appearing  elderly woman in no distress.  VITAL SIGNS:  Blood pressure today was 190/80, the pulse was 100 and  regular, respirations 18, weight was 144 pounds.  NECK:  No jugular venous distention.  LUNGS:  Clear bilaterally to auscultation.  No wheezes, rales, or  rhonchi are present.  CARDIOVASCULAR:  Regular rate and rhythm.  Normal S1 and S2.  There is a  soft S4 gallop present.  EXTREMITIES:  No edema.   Interrogation of her pacemaker demonstrates a Medtronic Prodigy dual-  chamber device, P-waves were 0.35, the R-waves were 8, the impedance 176  in the A, 652 in the V, threshold 2.5 in the A, 1.5 in the RV.  Battery  voltage was 2.74 V.  Underlying rhythm was sinus at 88.  She  is a 100% A  sensed and V sensed.   IMPRESSION:  1. History of symptomatic bradycardia.  2. Coronary artery disease status post myocardial infarction.  3. Hypertension.  4. Atrial lead dysfunction.   DISCUSSION:  Joanna Young is stable.  Her pacemaker is working normally today  with the exception of the atrial lead.  We have been able to reprogram  her device to assure atrial capture.  Fortunately, she has not paced  much.  Her blood pressure is quite elevated and the treatment as I would  defer to her primary physicians.  I will plan to see the patient for  pacemaker followup in 1 year.     Doylene Canning. Ladona Ridgel, MD  Electronically Signed    GWT/MedQ  DD: 01/14/2008  DT: 01/15/2008  Job #: [id number]   cc:   Karie Schwalbe, MD

## 2010-08-13 NOTE — Assessment & Plan Note (Signed)
[facility]                                 ON-CALL NOTE   NAME:Kutz, Daquisha                             MRN:          [id number]  DATE:07/09/2006                            DOB:          Oct 18, 1924    Patient of Dr. Alphonsus Sias.   Cindy with [employer] calling.   The patient fell.  I think it is just a minor abrasion, but they have to  call.  Treat symptomatically.  Let Dr. Alphonsus Sias know on Monday what the  issues are.     Jeffrey A. Tawanna Cooler, MD  Electronically Signed    JAT/MedQ  DD: 07/09/2006  DT: 07/09/2006  Job #: [id number]

## 2010-09-09 ENCOUNTER — Encounter: Payer: Self-pay | Admitting: Internal Medicine

## 2011-02-11 ENCOUNTER — Encounter: Payer: Self-pay | Admitting: Internal Medicine

## 2011-02-11 ENCOUNTER — Encounter: Payer: Medicare Other | Admitting: Internal Medicine

## 2011-02-11 ENCOUNTER — Ambulatory Visit (INDEPENDENT_AMBULATORY_CARE_PROVIDER_SITE_OTHER): Payer: Medicare Other | Admitting: Internal Medicine

## 2011-02-11 DIAGNOSIS — Z950 Presence of cardiac pacemaker: Secondary | ICD-10-CM

## 2011-02-11 DIAGNOSIS — I10 Essential (primary) hypertension: Secondary | ICD-10-CM

## 2011-02-11 DIAGNOSIS — I442 Atrioventricular block, complete: Secondary | ICD-10-CM

## 2011-02-11 DIAGNOSIS — I2510 Atherosclerotic heart disease of native coronary artery without angina pectoris: Secondary | ICD-10-CM

## 2011-02-11 LAB — PACEMAKER DEVICE OBSERVATION
AL THRESHOLD: 1 V
RV LEAD IMPEDENCE PM: 522 Ohm
RV LEAD THRESHOLD: 0.75 V

## 2011-02-11 NOTE — Assessment & Plan Note (Signed)
Her device is working normally. Will recheck in several months. 

## 2011-02-11 NOTE — Patient Instructions (Signed)
Your physician recommends that you schedule a follow-up appointment in: 1 year  

## 2011-02-11 NOTE — Progress Notes (Signed)
HPI Joanna Young returns today for followup. She is a pleasant 77 yo woman with a h/o symptomatic bradycardia s/p PPM. She has a h/o HTN. She has done well since we saw her last. She denies c/p or sob or edema. No syncope. She has rare cough.  No Known Allergies   Current Outpatient Prescriptions  Medication Sig Dispense Refill  . acetaminophen (TYLENOL) 325 MG tablet Take 650 mg by mouth every 6 (six) hours as needed.        Marland Kitchen albuterol (VENTOLIN HFA) 108 (90 BASE) MCG/ACT inhaler Inhale into the lungs. Use as directed.       Marland Kitchen alum & mag hydroxide-simeth (MINTOX) 200-200-20 MG/5ML suspension Take by mouth every 6 (six) hours as needed.        Marland Kitchen amLODipine (NORVASC) 5 MG tablet Take 5 mg by mouth daily.        Marland Kitchen aspirin 325 MG EC tablet Take 325 mg by mouth daily.        . baclofen (LIORESAL) 10 MG tablet Take 10 mg by mouth 3 (three) times daily.        . Cholecalciferol (VITAMIN D) 400 UNITS capsule Take 800 Units by mouth daily.        Marland Kitchen glipiZIDE (GLUCOTROL XL) 2.5 MG 24 hr tablet Take 2.5 mg by mouth daily.        . Hypromellose (TEARGEN II) 0.4 % SOLN Apply 2 drops to eye 4 (four) times daily as needed. In each eye.       . levothyroxine (SYNTHROID, LEVOTHROID) 100 MCG tablet Take 100 mcg by mouth daily.        Marland Kitchen loratadine (CLARITIN) 10 MG tablet Take 10 mg by mouth daily.        Marland Kitchen losartan-hydrochlorothiazide (HYZAAR) 100-12.5 MG per tablet Take 1 tablet by mouth daily.        . metFORMIN (GLUCOPHAGE) 500 MG tablet Take 500 mg by mouth 2 (two) times daily with a meal.        . polyvinyl alcohol (LIQUIFILM TEARS) 1.4 % ophthalmic solution Place 2 drops into both eyes 4 (four) times daily.        . promethazine (PHENERGAN) 12.5 MG tablet Take 12.5 mg by mouth every 4 (four) hours as needed. For nausea/vomiting.       . sertraline (ZOLOFT) 100 MG tablet Take 50 mg by mouth daily.           Past Medical History  Diagnosis Date  . Coronary artery disease 1/98  . Diabetes mellitus    Type II  . Depression   . GERD (gastroesophageal reflux disease)   . Hypertension   . Thyroid disease     Hyperthyroidism  . Osteoporosis   . Allergic rhinitis   . Pacemaker 01/26/2010    new dual-chamber     ROS:   All systems reviewed and negative except as noted in the HPI.   Past Surgical History  Procedure Date  . Cholecystectomy 1996  . Abdominal hysterectomy 1960's  . Pacemaker placement 1998    Permanent pacemaker  . Tonsillectomy 1944  . Vaginal delivery     x6  . Iwmi 1/98  . US echocardiography 2/07    EF 45% -diast. dys. Fr.  . Cataract extraction 02/14/06    OS  . Insert / replace / remove pacemaker 01/26/2010    new dual chamber pacemaker      History reviewed. No pertinent family history.   History   Social  History  . Marital Status: Widowed    Spouse Name: N/A    Number of Children: 6  . Years of Education: N/A   Occupational History  . Retired    Social History Main Topics  . Smoking status: Never Smoker   . Smokeless tobacco: Not on file  . Alcohol Use: No  . Drug Use: No  . Sexually Active:    Other Topics Concern  . Not on file   Social History Narrative   Pt is disabled.Gets regular exercise.Husband in prison.     BP 120/64  Pulse 78  Wt 57.607 kg (127 lb)  Physical Exam:  Well appearing elderly woman, NAD HEENT: Unremarkable Neck:  No JVD, no thyromegally Lymphatics:  No adenopathy Back:  No CVA tenderness Lungs:  Clear with no wheezes. Well healed PPM incision. HEART:  Regular rate rhythm, no murmurs, no rubs, no clicks Abd:  soft, positive bowel sounds, no organomegally, no rebound, no guarding Ext:  2 plus pulses, no edema, no cyanosis, no clubbing Skin:  No rashes no nodules Neuro:  CN II through XII intact, motor grossly intact  EKG NSR  DEVICE  Normal device function.  See PaceArt for details.   Assess/Plan:

## 2011-02-11 NOTE — Assessment & Plan Note (Signed)
Her blood pressure is well controlled. She will continue a low sodium diet. 

## 2012-03-07 ENCOUNTER — Encounter: Payer: Self-pay | Admitting: *Deleted

## 2012-03-13 ENCOUNTER — Encounter: Payer: Medicare Other | Admitting: Internal Medicine

## 2012-05-15 ENCOUNTER — Encounter: Payer: Medicare Other | Admitting: Internal Medicine

## 2012-06-05 ENCOUNTER — Encounter: Payer: Medicare Other | Admitting: Internal Medicine

## 2012-06-13 ENCOUNTER — Encounter: Payer: Self-pay | Admitting: *Deleted

## 2013-10-22 ENCOUNTER — Encounter: Payer: Self-pay | Admitting: *Deleted

## 2015-01-17 ENCOUNTER — Emergency Department: Payer: Medicare Other

## 2015-01-17 ENCOUNTER — Encounter: Payer: Self-pay | Admitting: Emergency Medicine

## 2015-01-17 ENCOUNTER — Observation Stay
Admission: EM | Admit: 2015-01-17 | Discharge: 2015-01-19 | Payer: Medicare Other | Attending: Internal Medicine | Admitting: Internal Medicine

## 2015-01-17 DIAGNOSIS — J90 Pleural effusion, not elsewhere classified: Secondary | ICD-10-CM | POA: Diagnosis not present

## 2015-01-17 DIAGNOSIS — J309 Allergic rhinitis, unspecified: Secondary | ICD-10-CM | POA: Insufficient documentation

## 2015-01-17 DIAGNOSIS — F329 Major depressive disorder, single episode, unspecified: Secondary | ICD-10-CM | POA: Diagnosis not present

## 2015-01-17 DIAGNOSIS — E0590 Thyrotoxicosis, unspecified without thyrotoxic crisis or storm: Secondary | ICD-10-CM | POA: Diagnosis not present

## 2015-01-17 DIAGNOSIS — Z7982 Long term (current) use of aspirin: Secondary | ICD-10-CM | POA: Diagnosis not present

## 2015-01-17 DIAGNOSIS — J9811 Atelectasis: Secondary | ICD-10-CM | POA: Insufficient documentation

## 2015-01-17 DIAGNOSIS — R531 Weakness: Secondary | ICD-10-CM | POA: Diagnosis not present

## 2015-01-17 DIAGNOSIS — E039 Hypothyroidism, unspecified: Secondary | ICD-10-CM | POA: Diagnosis not present

## 2015-01-17 DIAGNOSIS — R42 Dizziness and giddiness: Secondary | ICD-10-CM | POA: Insufficient documentation

## 2015-01-17 DIAGNOSIS — Z950 Presence of cardiac pacemaker: Secondary | ICD-10-CM | POA: Insufficient documentation

## 2015-01-17 DIAGNOSIS — I2510 Atherosclerotic heart disease of native coronary artery without angina pectoris: Secondary | ICD-10-CM | POA: Insufficient documentation

## 2015-01-17 DIAGNOSIS — J189 Pneumonia, unspecified organism: Secondary | ICD-10-CM | POA: Diagnosis not present

## 2015-01-17 DIAGNOSIS — Z66 Do not resuscitate: Secondary | ICD-10-CM | POA: Insufficient documentation

## 2015-01-17 DIAGNOSIS — I5022 Chronic systolic (congestive) heart failure: Secondary | ICD-10-CM | POA: Diagnosis not present

## 2015-01-17 DIAGNOSIS — F0390 Unspecified dementia without behavioral disturbance: Secondary | ICD-10-CM | POA: Diagnosis not present

## 2015-01-17 DIAGNOSIS — R55 Syncope and collapse: Secondary | ICD-10-CM | POA: Insufficient documentation

## 2015-01-17 DIAGNOSIS — E119 Type 2 diabetes mellitus without complications: Secondary | ICD-10-CM | POA: Diagnosis not present

## 2015-01-17 DIAGNOSIS — M810 Age-related osteoporosis without current pathological fracture: Secondary | ICD-10-CM | POA: Insufficient documentation

## 2015-01-17 DIAGNOSIS — Z79899 Other long term (current) drug therapy: Secondary | ICD-10-CM | POA: Diagnosis not present

## 2015-01-17 DIAGNOSIS — Z7951 Long term (current) use of inhaled steroids: Secondary | ICD-10-CM | POA: Diagnosis not present

## 2015-01-17 DIAGNOSIS — K219 Gastro-esophageal reflux disease without esophagitis: Secondary | ICD-10-CM | POA: Insufficient documentation

## 2015-01-17 DIAGNOSIS — Z7984 Long term (current) use of oral hypoglycemic drugs: Secondary | ICD-10-CM | POA: Insufficient documentation

## 2015-01-17 DIAGNOSIS — I517 Cardiomegaly: Secondary | ICD-10-CM | POA: Insufficient documentation

## 2015-01-17 DIAGNOSIS — I10 Essential (primary) hypertension: Secondary | ICD-10-CM | POA: Insufficient documentation

## 2015-01-17 DIAGNOSIS — R112 Nausea with vomiting, unspecified: Secondary | ICD-10-CM | POA: Insufficient documentation

## 2015-01-17 DIAGNOSIS — Z8249 Family history of ischemic heart disease and other diseases of the circulatory system: Secondary | ICD-10-CM | POA: Insufficient documentation

## 2015-01-17 DIAGNOSIS — Z9842 Cataract extraction status, left eye: Secondary | ICD-10-CM | POA: Insufficient documentation

## 2015-01-17 DIAGNOSIS — R0602 Shortness of breath: Secondary | ICD-10-CM

## 2015-01-17 LAB — BASIC METABOLIC PANEL
ANION GAP: 6 (ref 5–15)
BUN: 35 mg/dL — ABNORMAL HIGH (ref 6–20)
CALCIUM: 9 mg/dL (ref 8.9–10.3)
CO2: 22 mmol/L (ref 22–32)
Chloride: 109 mmol/L (ref 101–111)
Creatinine, Ser: 1.31 mg/dL — ABNORMAL HIGH (ref 0.44–1.00)
GFR, EST AFRICAN AMERICAN: 41 mL/min — AB (ref >60)
GFR, EST NON AFRICAN AMERICAN: 35 mL/min — AB (ref >60)
Glucose, Bld: 230 mg/dL — ABNORMAL HIGH (ref 65–99)
POTASSIUM: 4.5 mmol/L (ref 3.5–5.1)
SODIUM: 137 mmol/L (ref 135–145)

## 2015-01-17 LAB — CBC
HCT: 37.4 % (ref 35.0–47.0)
Hemoglobin: 12.2 g/dL (ref 12.0–16.0)
MCH: 30.1 pg (ref 26.0–34.0)
MCHC: 32.6 g/dL (ref 32.0–36.0)
MCV: 92.4 fL (ref 80.0–100.0)
PLATELETS: 229 10*3/uL (ref 150–440)
RBC: 4.05 MIL/uL (ref 3.80–5.20)
RDW: 13.4 % (ref 11.5–14.5)
WBC: 10.7 10*3/uL (ref 3.6–11.0)

## 2015-01-17 LAB — URINALYSIS COMPLETE WITH MICROSCOPIC (ARMC ONLY)
BILIRUBIN URINE: NEGATIVE
Bacteria, UA: NONE SEEN
Glucose, UA: NEGATIVE mg/dL
Hgb urine dipstick: NEGATIVE
Ketones, ur: NEGATIVE mg/dL
Leukocytes, UA: NEGATIVE
Nitrite: NEGATIVE
PH: 5 (ref 5.0–8.0)
PROTEIN: NEGATIVE mg/dL
SQUAMOUS EPITHELIAL / LPF: NONE SEEN
Specific Gravity, Urine: 1.013 (ref 1.005–1.030)
WBC, UA: NONE SEEN WBC/hpf (ref 0–5)

## 2015-01-17 LAB — AMMONIA: Ammonia: 17 umol/L (ref 9–35)

## 2015-01-17 LAB — GLUCOSE, CAPILLARY
GLUCOSE-CAPILLARY: 217 mg/dL — AB (ref 65–99)
Glucose-Capillary: 198 mg/dL — ABNORMAL HIGH (ref 65–99)

## 2015-01-17 LAB — TROPONIN I
Troponin I: <0.03 ng/mL (ref <0.031)
Troponin I: <0.03 ng/mL (ref <0.031)

## 2015-01-17 LAB — MAGNESIUM: MAGNESIUM: 1.7 mg/dL (ref 1.7–2.4)

## 2015-01-17 LAB — MRSA PCR SCREENING: MRSA BY PCR: NEGATIVE

## 2015-01-17 LAB — TSH: TSH: 1.422 u[IU]/mL (ref 0.350–4.500)

## 2015-01-17 MED ORDER — LOSARTAN POTASSIUM 50 MG PO TABS
100.0000 mg | ORAL_TABLET | Freq: Every day | ORAL | Status: DC
  Administered 2015-01-18 – 2015-01-19 (×2): 100 mg via ORAL
  Filled 2015-01-17 (×2): qty 2

## 2015-01-17 MED ORDER — ACETAMINOPHEN 325 MG PO TABS
650.0000 mg | ORAL_TABLET | Freq: Four times a day (QID) | ORAL | Status: DC | PRN
  Administered 2015-01-17: 17:00:00 650 mg via ORAL

## 2015-01-17 MED ORDER — HYDROCHLOROTHIAZIDE 12.5 MG PO CAPS
12.5000 mg | ORAL_CAPSULE | Freq: Every day | ORAL | Status: DC
  Administered 2015-01-18 – 2015-01-19 (×2): 12.5 mg via ORAL
  Filled 2015-01-17 (×2): qty 1

## 2015-01-17 MED ORDER — ADULT MULTIVITAMIN W/MINERALS CH
1.0000 | ORAL_TABLET | Freq: Every day | ORAL | Status: DC
  Administered 2015-01-17 – 2015-01-19 (×3): 1 via ORAL
  Filled 2015-01-17 (×3): qty 1

## 2015-01-17 MED ORDER — LORATADINE 10 MG PO TABS: 10.0000 mg | ORAL_TABLET | Freq: Every day | ORAL | Status: DC | PRN

## 2015-01-17 MED ORDER — LEVOFLOXACIN 500 MG PO TABS: 500.0000 mg | ORAL_TABLET | Freq: Every day | ORAL | Status: DC

## 2015-01-17 MED ORDER — ACETAMINOPHEN 325 MG PO TABS
650.0000 mg | ORAL_TABLET | Freq: Four times a day (QID) | ORAL | Status: DC | PRN
  Filled 2015-01-17: qty 2

## 2015-01-17 MED ORDER — LOSARTAN POTASSIUM-HCTZ 100-12.5 MG PO TABS: 1.0000 | ORAL_TABLET | Freq: Every day | ORAL | Status: DC

## 2015-01-17 MED ORDER — ONDANSETRON HCL 4 MG PO TABS
4.0000 mg | ORAL_TABLET | Freq: Four times a day (QID) | ORAL | Status: DC | PRN
  Administered 2015-01-18: 04:00:00 4 mg via ORAL
  Filled 2015-01-17: qty 1

## 2015-01-17 MED ORDER — INSULIN ASPART 100 UNIT/ML ~~LOC~~ SOLN: 0.0000 [IU] | Freq: Every day | SUBCUTANEOUS | Status: DC

## 2015-01-17 MED ORDER — VITAMIN D3 10 MCG (400 UNIT) PO TABS
800.0000 [IU] | ORAL_TABLET | Freq: Every day | ORAL | Status: DC
  Administered 2015-01-18 – 2015-01-19 (×2): 800 [IU] via ORAL
  Filled 2015-01-17 (×2): qty 2

## 2015-01-17 MED ORDER — SERTRALINE HCL 50 MG PO TABS
50.0000 mg | ORAL_TABLET | Freq: Every day | ORAL | Status: DC
  Administered 2015-01-17 – 2015-01-18 (×2): 50 mg via ORAL
  Filled 2015-01-17 (×2): qty 1

## 2015-01-17 MED ORDER — ENOXAPARIN SODIUM 30 MG/0.3ML ~~LOC~~ SOLN
30.0000 mg | SUBCUTANEOUS | Status: DC
  Administered 2015-01-17 – 2015-01-18 (×2): 30 mg via SUBCUTANEOUS
  Filled 2015-01-17 (×2): qty 0.3

## 2015-01-17 MED ORDER — GLIPIZIDE ER 5 MG PO TB24
5.0000 mg | ORAL_TABLET | Freq: Every day | ORAL | Status: DC
  Administered 2015-01-18 – 2015-01-19 (×2): 5 mg via ORAL
  Filled 2015-01-17 (×2): qty 1

## 2015-01-17 MED ORDER — LEVOFLOXACIN 500 MG PO TABS: 500.0000 mg | ORAL_TABLET | ORAL | Status: DC

## 2015-01-17 MED ORDER — SODIUM CHLORIDE 0.9 % IJ SOLN
3.0000 mL | Freq: Two times a day (BID) | INTRAMUSCULAR | Status: DC
  Administered 2015-01-17 – 2015-01-19 (×5): 3 mL via INTRAVENOUS

## 2015-01-17 MED ORDER — AMLODIPINE BESYLATE 5 MG PO TABS
5.0000 mg | ORAL_TABLET | Freq: Every day | ORAL | Status: DC
  Administered 2015-01-17 – 2015-01-18 (×2): 5 mg via ORAL
  Filled 2015-01-17 (×2): qty 1

## 2015-01-17 MED ORDER — ASPIRIN 81 MG PO CHEW
81.0000 mg | CHEWABLE_TABLET | Freq: Every day | ORAL | Status: DC
  Administered 2015-01-17 – 2015-01-19 (×3): 81 mg via ORAL
  Filled 2015-01-17 (×2): qty 1

## 2015-01-17 MED ORDER — LEVOTHYROXINE SODIUM 112 MCG PO TABS
112.0000 ug | ORAL_TABLET | Freq: Every day | ORAL | Status: DC
  Administered 2015-01-18 – 2015-01-19 (×2): 112 ug via ORAL
  Filled 2015-01-17 (×2): qty 1

## 2015-01-17 MED ORDER — ONDANSETRON HCL 4 MG/2ML IJ SOLN
4.0000 mg | Freq: Four times a day (QID) | INTRAMUSCULAR | Status: DC | PRN
  Administered 2015-01-17: 4 mg via INTRAVENOUS
  Filled 2015-01-17: qty 2

## 2015-01-17 MED ORDER — LEVOFLOXACIN 750 MG PO TABS
750.0000 mg | ORAL_TABLET | Freq: Once | ORAL | Status: AC
  Administered 2015-01-17: 17:00:00 750 mg via ORAL
  Filled 2015-01-17: qty 1

## 2015-01-17 MED ORDER — SODIUM CHLORIDE 0.9 % IV SOLN
INTRAVENOUS | Status: DC
Start: 2015-01-17 — End: 2015-01-18
  Administered 2015-01-17: 17:00:00 via INTRAVENOUS

## 2015-01-17 MED ORDER — SODIUM CHLORIDE 0.9 % IV BOLUS (SEPSIS)
500.0000 mL | Freq: Once | INTRAVENOUS | Status: AC
  Administered 2015-01-17: 500 mL via INTRAVENOUS

## 2015-01-17 MED ORDER — INSULIN ASPART 100 UNIT/ML ~~LOC~~ SOLN
0.0000 [IU] | Freq: Three times a day (TID) | SUBCUTANEOUS | Status: DC
  Administered 2015-01-17: 17:00:00 3 [IU] via SUBCUTANEOUS
  Administered 2015-01-18: 1 [IU] via SUBCUTANEOUS
  Administered 2015-01-18 (×2): 2 [IU] via SUBCUTANEOUS
  Filled 2015-01-17: qty 1
  Filled 2015-01-17 (×2): qty 2
  Filled 2015-01-17: qty 3

## 2015-01-17 MED ORDER — FLUTICASONE PROPIONATE 50 MCG/ACT NA SUSP
1.0000 | Freq: Every day | NASAL | Status: DC
  Administered 2015-01-18 – 2015-01-19 (×2): 1 via NASAL
  Filled 2015-01-17: qty 16

## 2015-01-17 MED ORDER — ACETAMINOPHEN 650 MG RE SUPP: 650.0000 mg | Freq: Four times a day (QID) | RECTAL | Status: DC | PRN

## 2015-01-17 NOTE — Progress Notes (Signed)
ANTIBIOTIC CONSULT NOTE - INITIAL  Pharmacy Consult for levofloxacin Indication: rule out pneumonia  No Known Allergies  Patient Measurements: Height: 5\' 1"  (154.9 cm) Weight: 127 lb (57.607 kg) IBW/kg (Calculated) : 47.8  Vital Signs: Temp: 97.8 F (36.6 C) (10/22 1511) Temp Source: Oral (10/22 1511) BP: 156/64 mmHg (10/22 1511) Pulse Rate: 72 (10/22 1511) Intake/Output from previous day:   Intake/Output from this shift:    Labs:  Recent Labs  01/17/15 1047  WBC 10.7  HGB 12.2  PLT 229  CREATININE 1.31*   Estimated Creatinine Clearance: 24.2 mL/min (by C-G formula based on Cr of 1.31). No results for input(s): VANCOTROUGH, VANCOPEAK, VANCORANDOM, GENTTROUGH, GENTPEAK, GENTRANDOM, TOBRATROUGH, TOBRAPEAK, TOBRARND, AMIKACINPEAK, AMIKACINTROU, AMIKACIN in the last 72 hours.   Microbiology: No results found for this or any previous visit (from the past 720 hour(s)).  Medical History: Past Medical History  Diagnosis Date  . Coronary artery disease 1/98  . Diabetes mellitus     Type II  . Depression   . GERD (gastroesophageal reflux disease)   . Hypertension   . Thyroid disease     Hyperthyroidism  . Osteoporosis   . Allergic rhinitis   . Pacemaker 01/26/2010    new dual-chamber     Medications:  Anti-infectives    Start     Dose/Rate Route Frequency Ordered Stop   01/19/15 1600  levofloxacin (LEVAQUIN) tablet 500 mg     500 mg Oral Every 48 hours 01/17/15 1550     01/17/15 1600  levofloxacin (LEVAQUIN) tablet 750 mg     750 mg Oral  Once 01/17/15 1548     01/17/15 1530  levofloxacin (LEVAQUIN) tablet 500 mg  Status:  Discontinued     500 mg Oral Daily 01/17/15 1529 01/17/15 1548     Assessment: Pharmacy consult entered to dose levofloxacin for possible pneumonia in this 81 year old female.  Plan:  Dosed at levofloxacin 750 mg once followed by 500 mg q48 hours per renal function and indication.  Jodelle RedMary M Tisheena Maguire 01/17/2015,3:54 PM

## 2015-01-17 NOTE — ED Notes (Signed)
[facility] with c/o weakness, dizziness, trouble seeing out of right eye. EMS reports equal grip strengths, pt with nausea and given 4mg  of zofran IV en route. EMS also reports pt took scheduled medications about 20 min PTA.

## 2015-01-17 NOTE — H&P (Signed)
[facility] - [place] at [facility]   PATIENT NAME: Joanna Young    MR#:  [id number]  DATE OF BIRTH:  1924-11-18  DATE OF ADMISSION:  01/17/2015  PRIMARY CARE PHYSICIAN: Bari Edward, MD   REQUESTING/REFERRING PHYSICIAN: Dr. Harlene Ramus  CHIEF COMPLAINT:   Chief Complaint  Patient presents with  . Weakness    HISTORY OF PRESENT ILLNESS:  Joanna Young  is a 81 y.o. female with a known history of coronary artery disease, diabetes mellitus, depression, dementia, heart block status post pacemaker presents to the hospital secondary to generalized weakness. -Patient is unable to provide most of the history due to her dementia. She has been a resident at [facility] facility for several years now. Most of the history is obtained from daughter at son at bedside. According to them, patient does have confusion from her dementia but she is pretty ambulatory at baseline. Son saw her almost 5 days ago, when she was her normal self. This morning when they came to take her out for the weekend, patient said that she couldn't get out of bed. No fevers or chills were notified. She felt a little nauseous today. No diarrhea, she denies any chest pain. Since yesterday she has been feeling weak and has used a walker last night to get to the bathroom. Normally she doesn't use any assisted devices according to the family. Labs and CT head here did not reveal any acute abnormality. She is being admitted under observation for her weakness.  PAST MEDICAL HISTORY:   Past Medical History  Diagnosis Date  . Coronary artery disease 1/98  . Diabetes mellitus     Type II  . Depression   . GERD (gastroesophageal reflux disease)   . Hypertension   . Thyroid disease     Hyperthyroidism  . Osteoporosis   . Allergic rhinitis   . Pacemaker 01/26/2010    new dual-chamber     PAST SURGICAL HISTORY:   Past Surgical History  Procedure Laterality Date  . Cholecystectomy  1996   . Abdominal hysterectomy  1960's  . Pacemaker placement  1998    Permanent pacemaker  . Tonsillectomy  1944  . Vaginal delivery      x6  . Iwmi  1/98  . US echocardiography  2/07    EF 45% -diast. dys. Fr.  . Cataract extraction  02/14/06    OS  . Insert / replace / remove pacemaker  01/26/2010    new dual chamber pacemaker     SOCIAL HISTORY:   Social History  Substance Use Topics  . Smoking status: Never Smoker   . Smokeless tobacco: Not on file  . Alcohol Use: No    FAMILY HISTORY:  Father- DM Mother- CAD and HTN  DRUG ALLERGIES:  No Known Allergies  REVIEW OF SYSTEMS:   Review of Systems  Unable to perform ROS: dementia   MEDICATIONS AT HOME:   Prior to Admission medications   Medication Sig Start Date End Date Taking? Authorizing Provider  acetaminophen (TYLENOL) 325 MG tablet Take 650 mg by mouth every 6 (six) hours as needed for mild pain or moderate pain.    Yes Historical Provider, MD  amLODipine (NORVASC) 5 MG tablet Take 5 mg by mouth at bedtime.    Yes Historical Provider, MD  aspirin 81 MG chewable tablet Chew 81 mg by mouth daily.   Yes Historical Provider, MD  Cholecalciferol (VITAMIN D3) 400 UNITS tablet Take 800 Units by  mouth daily.   Yes Historical Provider, MD  fluticasone (FLONASE) 50 MCG/ACT nasal spray Place 1 spray into both nostrils daily.   Yes Historical Provider, MD  glipiZIDE (GLUCOTROL XL) 5 MG 24 hr tablet Take 5 mg by mouth daily.   Yes Historical Provider, MD  levothyroxine (SYNTHROID, LEVOTHROID) 112 MCG tablet Take 112 mcg by mouth daily before breakfast.   Yes Historical Provider, MD  loratadine (CLARITIN) 10 MG tablet Take 10 mg by mouth daily as needed for allergies.    Yes Historical Provider, MD  losartan-hydrochlorothiazide (HYZAAR) 100-12.5 MG per tablet Take 1 tablet by mouth daily.     Yes Historical Provider, MD  sertraline (ZOLOFT) 50 MG tablet Take 50 mg by mouth at bedtime.   Yes Historical Provider, MD  promethazine  (PHENERGAN) 12.5 MG tablet Take 12.5 mg by mouth every 4 (four) hours as needed. For nausea/vomiting.     Historical Provider, MD      VITAL SIGNS:  Blood pressure 147/65, pulse 69, temperature 97.5 F (36.4 C), temperature source Oral, resp. rate 15, weight 61.236 kg (135 lb), SpO2 92 %.  PHYSICAL EXAMINATION:   Physical Exam  GENERAL:  81 y.o.-year-old patient lying in the bed with no acute distress.  EYES: Pupils equal, round, reactive to light and accommodation. No scleral icterus. Extraocular muscles intact.  HEENT: Head atraumatic, normocephalic. Oropharynx and nasopharynx clear.  Left ear tympanic membrane is opaque. Positive for hearing loss NECK:  Supple, no jugular venous distention. No thyroid enlargement, no tenderness.  LUNGS: Normal breath sounds bilaterally, no wheezing, rales,rhonchi or crepitation. No use of accessory muscles of respiration. Decreased bibasilar breath sounds CARDIOVASCULAR: S1, S2 normal. No  rubs, or gallops.  3/6 systolic murmur is present  ABDOMEN: Soft, nontender, nondistended. Bowel sounds present. No organomegaly or mass.  EXTREMITIES: No pedal edema, cyanosis, or clubbing.  NEUROLOGIC: Cranial nerves II through XII are intact. Muscle strength 5/5 in all extremities. Sensation intact. Gait not checked.  Tremor of head noted- chronic. PSYCHIATRIC: The patient is alert and oriented x 1.  SKIN: No obvious rash, lesion, or ulcer.  HAIR: the distal tips are more orange, where as her proximal hair strand color is white  LABORATORY PANEL:   CBC  Recent Labs Lab 01/17/15 1047  WBC 10.7  HGB 12.2  HCT 37.4  PLT 229   ------------------------------------------------------------------------------------------------------------------  Chemistries   Recent Labs Lab 01/17/15 1047  NA 137  K 4.5  CL 109  CO2 22  GLUCOSE 230*  BUN 35*  CREATININE 1.31*  CALCIUM 9.0    ------------------------------------------------------------------------------------------------------------------  Cardiac Enzymes  Recent Labs Lab 01/17/15 1047  TROPONINI <0.03   ------------------------------------------------------------------------------------------------------------------  RADIOLOGY:  Ct Head Wo Contrast  01/17/2015  CLINICAL DATA:  Weakness, dizziness, and difficulty seeing of the right eye. EXAM: CT HEAD WITHOUT CONTRAST TECHNIQUE: Contiguous axial images were obtained from the base of the skull through the vertex without intravenous contrast. COMPARISON:  None FINDINGS: There is mild, age-appropriate cerebral atrophy. There is no evidence of acute cortical infarct, intracranial hemorrhage, mass, midline shift, or extra-axial fluid collection. Visualized orbits are unremarkable. Small amount of secretions are noted in the left sphenoid sinus. There are small bilateral mastoid effusions. Calcified atherosclerosis is noted at the skullbase. IMPRESSION: No evidence of acute intracranial abnormality. Unremarkable CT appearance of the brain for age. Electronically Signed   By: Sebastian AcheAllen  Grady M.D.   On: 01/17/2015 12:17   Dg Chest Port 1 View  01/17/2015  CLINICAL DATA:  Weakness  EXAM: PORTABLE CHEST 1 VIEW COMPARISON:  None. FINDINGS: Right subclavian dual lead pacemaker device. Moderate cardiomegaly. Bilateral central and basilar airspace disease. Bilateral pleural effusions. No pneumothorax. IMPRESSION: Cardiomegaly with bilateral airspace disease and bilateral effusions. Electronically Signed   By: Jolaine Click M.D.   On: 01/17/2015 11:25    EKG:   Orders placed or performed during the hospital encounter of 01/17/15  . ED EKG  . ED EKG    IMPRESSION AND PLAN:   Kamelia Lampkins  is a 81 y.o. female with a known history of coronary artery disease, diabetes mellitus, depression, dementia, heart block status post pacemaker presents to the hospital secondary to generalized  weakness.  #1 generalized weakness-no local weakness noted on exam. Patient is able to know all extremities and strength is 4+ to 5/5 -CT of the head with no acute findings. Has a pacemaker, so cannot get an MRI. Follow-up repeat CT tomorrow. -Urine analysis with no evidence of any infection. Chest x-ray with bilateral airspace disease. Worse on the left side. -Could be pneumonia. Order blood cultures and start on Levaquin - TSH, magnesium and ammonia levels -Physical therapy consult ordered   #2  hypertension-continue home medications. Patient on Norvasc, losartan and hydrochlorothiazide  #3 diabetes mellitus-on glipizide. Also started sliding scale insulin  #4 hypothyroidism-check TSH, continue her Synthroid.  #5 DVT prophylaxis-on Lovenox    All the records are reviewed and case discussed with ED provider. Management plans discussed with the patient, family and they are in agreement.  CODE STATUS: Full code. Discussed with daughter who is POA, at bedside  TOTAL TIME TAKING CARE OF THIS PATIENT: 50 minutes.    Enid Baas M.D on 01/17/2015 at 3:04 PM  Between 7am to 6pm - Pager - [phone]  After 6pm go to [email or web address] - password [email or web address] [facility]  [facility]  Office  [phone]  CC: Primary care physician; Bari Edward, MD

## 2015-01-17 NOTE — Progress Notes (Signed)
Order for enoxaparin 40 mg SubQ daily was changed to enoxaparin 30 mg subcutaneously daily per policy due to renal function (CrCl <30 mL/min)

## 2015-01-17 NOTE — Progress Notes (Addendum)
Pt alert disoriented to date and time, form [facility]. C/O generalized weakness, headache, and nausea. Prn medications given with relief. Up to bedside commode x1 assist.  Family at bedside.

## 2015-01-17 NOTE — ED Provider Notes (Addendum)
[facility] Emergency Department Provider Note  ____________________________________________   I have reviewed the triage vital signs and the nursing notes.   HISTORY  Chief Complaint Weakness    HPI Joanna Young is a 81 y.o. female who lives in a nursing facility. She does suffer from dementia. History is per patient and per daughter and per EMS. According to patient, she had no visual field deficits or change in her vision, she states she always has "poor vision". She states that she was lightheaded starting last night and going into today. She denies true vertigo on a regular basis although "sometimes" it feels like she might have some. She denies any focal numbness or weakness or difficulty speaking. Her daughter, who is visiting her, states that at baseline the patient can walk and she seemed to lightheaded to get this morning. The patient herself has had no other complaints. Again very limited history from this 81 year old woman however she denies dysuria or urinary frequency chest pain shortness of breath vomiting or headache. She also denies focal neurologic deficit. Her only complaint to me is that she is lightheaded.  Past Medical History  Diagnosis Date  . Coronary artery disease 1/98  . Diabetes mellitus     Type II  . Depression   . GERD (gastroesophageal reflux disease)   . Hypertension   . Thyroid disease     Hyperthyroidism  . Osteoporosis   . Allergic rhinitis   . Pacemaker 01/26/2010    new dual-chamber     Patient Active Problem List   Diagnosis Date Noted  . PPM-[employer]tr[employer] 01/12/2010  . CELLULITIS, LEFT LEG 07/21/2008  . ALLERGIC RHINITIS 12/13/2007  . INSOMNIA 04/13/2007  . SINUSITIS, ACUTE 12/06/2006  . DRY EYE SYNDROME 08/11/2006  . HYPERTHYROIDISM 07/05/2006  . DIABETES MELLITUS, TYPE II 07/05/2006  . ANEMIA 07/05/2006  . DEPRESSION 07/05/2006  . HYPERTENSION 07/05/2006  . CORONARY ARTERY DISEASE 07/05/2006  . OSTEOPOROSIS  07/05/2006  . GERD 03/02/2005    Past Surgical History  Procedure Laterality Date  . Cholecystectomy  1996  . Abdominal hysterectomy  1960's  . Pacemaker placement  1998    Permanent pacemaker  . Tonsillectomy  1944  . Vaginal delivery      x6  . Iwmi  1/98  . [place] echocardiography  2/07    EF 45% -diast. dys. Fr.  . Cataract extraction  02/14/06    OS  . Insert / replace / remove pacemaker  01/26/2010    new dual chamber pacemaker     Current Outpatient Rx  Name  Route  Sig  Dispense  Refill  . acetaminophen (TYLENOL) 325 MG tablet   Oral   Take 650 mg by mouth every 6 (six) hours as needed.           Marland Kitchen. albuterol (VENTOLIN HFA) 108 (90 BASE) MCG/ACT inhaler   Inhalation   Inhale into the lungs. Use as directed.          Marland Kitchen. alum & mag hydroxide-simeth (MINTOX) 200-200-20 MG/5ML suspension   Oral   Take by mouth every 6 (six) hours as needed.           Marland Kitchen. amLODipine (NORVASC) 5 MG tablet   Oral   Take 5 mg by mouth daily.           Marland Kitchen. aspirin 325 MG EC tablet   Oral   Take 325 mg by mouth daily.           .Marland Kitchen  baclofen (LIORESAL) 10 MG tablet   Oral   Take 10 mg by mouth 3 (three) times daily.           . Cholecalciferol (VITAMIN D) 400 UNITS capsule   Oral   Take 800 Units by mouth daily.           Marland Kitchen glipiZIDE (GLUCOTROL XL) 2.5 MG 24 hr tablet   Oral   Take 2.5 mg by mouth daily.           . Hypromellose (TEARGEN II) 0.4 % SOLN   Ophthalmic   Apply 2 drops to eye 4 (four) times daily as needed. In each eye.          . levothyroxine (SYNTHROID, LEVOTHROID) 100 MCG tablet   Oral   Take 100 mcg by mouth daily.           Marland Kitchen loratadine (CLARITIN) 10 MG tablet   Oral   Take 10 mg by mouth daily.           Marland Kitchen losartan-hydrochlorothiazide (HYZAAR) 100-12.5 MG per tablet   Oral   Take 1 tablet by mouth daily.           . metFORMIN (GLUCOPHAGE) 500 MG tablet   Oral   Take 500 mg by mouth 2 (two) times daily with a meal.           .  polyvinyl alcohol (LIQUIFILM TEARS) 1.4 % ophthalmic solution   Both Eyes   Place 2 drops into both eyes 4 (four) times daily.           . promethazine (PHENERGAN) 12.5 MG tablet   Oral   Take 12.5 mg by mouth every 4 (four) hours as needed. For nausea/vomiting.          . sertraline (ZOLOFT) 100 MG tablet   Oral   Take 50 mg by mouth daily.             Allergies Review of patient's allergies indicates no known allergies.  No family history on file.  Social History Social History  Substance Use Topics  . Smoking status: Never Smoker   . Smokeless tobacco: None  . Alcohol Use: No    Review of Systems Constitutional: No fever/chills Eyes: No visual changes. ENT: No sore throat. No stiff neck no neck pain Cardiovascular: Denies chest pain. Respiratory: Denies shortness of breath. Gastrointestinal:   no vomiting.  No diarrhea.  No constipation. Genitourinary: Negative for dysuria. Musculoskeletal: Negative lower extremity swelling Skin: Negative for rash. Neurological: Negative for headaches, focal weakness or numbness. 10-point ROS otherwise negative.  ____________________________________________   PHYSICAL EXAM:  VITAL SIGNS: ED Triage Vitals  Enc Vitals Group     BP 01/17/15 1043 182/65 mmHg     Pulse Rate 01/17/15 1043 70     Resp 01/17/15 1043 16     Temp 01/17/15 1043 97.5 F (36.4 C)     Temp Source 01/17/15 1043 Oral     SpO2 01/17/15 1043 95 %     Weight 01/17/15 1043 135 lb (61.236 kg)     Height --      Head Cir --      Peak Flow --      Pain Score --      Pain Loc --      Pain Edu? --      Excl. in GC? --     Constitutional: Alert and oriented to name and place unsure of the date, at baseline.  Well appearing and in no acute distress. Eyes: Conjunctivae are normal. PERRL. EOMI. Head: Atraumatic. Nose: No congestion/rhinnorhea. Mouth/Throat: Mucous membranes are moist.  Oropharynx non-erythematous. Neck: No stridor.   Nontender with no  meningismus Cardiovascular: Normal rate, regular rhythm. Grossly normal heart sounds.  Good peripheral circulation. Respiratory: Normal respiratory effort.  No retractions. Lungs CTAB. Gastrointestinal: Soft and nontender. No distention. No guarding no rebound Back:  There is no focal tenderness or step off there is no midline tenderness there are no lesions noted. there is no CVA tenderness Musculoskeletal: No lower extremity tenderness. No joint effusions, no DVT signs strong distal pulses no edema Neurologic:  Normal speech and language. No gross focal neurologic deficits are appreciated.  Skin:  Skin is warm, dry and intact. No rash noted. Psychiatric: Mood and affect are normal. Speech and behavior are normal.  ____________________________________________   LABS (all labs ordered are listed, but only abnormal results are displayed)  Labs Reviewed  CBC  BASIC METABOLIC PANEL  URINALYSIS COMPLETEWITH MICROSCOPIC ([facility] ONLY)  TROPONIN I   ____________________________________________  EKG   ____________________________________________  RADIOLOGY   ____________________________________________   PROCEDURES  Procedure(s) performed: None  Critical Care performed: None  ____________________________________________   INITIAL IMPRESSION / ASSESSMENT AND PLAN / ED COURSE  Pertinent labs & imaging results that were available during my care of the patient were reviewed by me and considered in my medical decision making (see chart for details).  Elderly patient with multiple different medical problems complains of weakness. To me she denies any visual field cut or vision changes and she is intact to confrontation take sent that I can determine. She states that she has no focal numbness or weakness and I do not see anything obvious on exam. Her complaint is that she feels lightheaded. This is obviously a very vague complaint which can be caused by multiple different entities.  She is not hypotensive fortunately. She did just take her blood pressure medication we'll watch her blood pressure. A urinary tract infection or a CVA or a pneumonia or anemia or any of the number different entities can cause her symptoms and we will check a broad workup to see if we can find out why she feels lightheaded today ____________________________________________  ----------------------------------------- 12:44 PM on 01/17/2015 -----------------------------------------  Patient remains nonfocal, family are very concerned about the fact that she is so weak. They state normally she can emulate with no difficulty. Patient states she feels too generally weak to do that today. There is no evidence of acute CVA. Workup is quite unremarkable aside from evidence of dehydration. We're giving her IV fluid and she feels "a little" better. Family does not feel that she is safe to be discharged because of her change in her capacity. I talked to the hospitalist and they agree with management and will admit the patient for observation.  FINAL CLINICAL IMPRESSION(S) / ED DIAGNOSES  Final diagnoses:  Weakness     Jeanmarie Plant, MD 01/17/15 1106  Jeanmarie Plant, MD 01/17/15 1245

## 2015-01-18 ENCOUNTER — Observation Stay: Payer: Medicare Other

## 2015-01-18 DIAGNOSIS — R531 Weakness: Secondary | ICD-10-CM | POA: Diagnosis not present

## 2015-01-18 LAB — BASIC METABOLIC PANEL
Anion gap: 4 — ABNORMAL LOW (ref 5–15)
BUN: 25 mg/dL — ABNORMAL HIGH (ref 6–20)
CALCIUM: 8.6 mg/dL — AB (ref 8.9–10.3)
CO2: 23 mmol/L (ref 22–32)
CREATININE: 1.28 mg/dL — AB (ref 0.44–1.00)
Chloride: 110 mmol/L (ref 101–111)
GFR, EST AFRICAN AMERICAN: 42 mL/min — AB (ref >60)
GFR, EST NON AFRICAN AMERICAN: 36 mL/min — AB (ref >60)
GLUCOSE: 160 mg/dL — AB (ref 65–99)
Potassium: 4.1 mmol/L (ref 3.5–5.1)
Sodium: 137 mmol/L (ref 135–145)

## 2015-01-18 LAB — GLUCOSE, CAPILLARY
GLUCOSE-CAPILLARY: 175 mg/dL — AB (ref 65–99)
GLUCOSE-CAPILLARY: 173 mg/dL — AB (ref 65–99)
GLUCOSE-CAPILLARY: 131 mg/dL — AB (ref 65–99)
GLUCOSE-CAPILLARY: 135 mg/dL — AB (ref 65–99)

## 2015-01-18 LAB — CBC
HCT: 35.4 % (ref 35.0–47.0)
Hemoglobin: 11.5 g/dL — ABNORMAL LOW (ref 12.0–16.0)
MCH: 29.8 pg (ref 26.0–34.0)
MCHC: 32.4 g/dL (ref 32.0–36.0)
MCV: 92.2 fL (ref 80.0–100.0)
PLATELETS: 234 10*3/uL (ref 150–440)
RBC: 3.84 MIL/uL (ref 3.80–5.20)
RDW: 13.5 % (ref 11.5–14.5)
WBC: 8.5 10*3/uL (ref 3.6–11.0)

## 2015-01-18 LAB — TROPONIN I

## 2015-01-18 MED ORDER — MECLIZINE HCL 25 MG PO TABS
12.5000 mg | ORAL_TABLET | Freq: Three times a day (TID) | ORAL | Status: DC
  Administered 2015-01-18 – 2015-01-19 (×4): 12.5 mg via ORAL
  Filled 2015-01-18 (×4): qty 1

## 2015-01-18 MED ORDER — LEVOFLOXACIN 750 MG PO TABS: 750.0000 mg | ORAL_TABLET | ORAL | Status: DC

## 2015-01-18 NOTE — Progress Notes (Signed)
ANTIBIOTIC CONSULT NOTE - FOLLOW UP   Pharmacy Consult for levofloxacin Indication: rule out pneumonia  No Known Allergies  Patient Measurements: Height: 5\' 1"  (154.9 cm) Weight: 127 lb (57.607 kg) IBW/kg (Calculated) : 47.8  Vital Signs: Temp: 98.1 F (36.7 C) (10/23 0414) Temp Source: Oral (10/23 0414) BP: 162/58 mmHg (10/23 0823) Pulse Rate: 78 (10/23 0823) Intake/Output from previous day: 10/22 0701 - 10/23 0700 In: 0  Out: 475 [Urine:475] Intake/Output from this shift:    Labs:  Recent Labs  01/17/15 1047 01/18/15 0351  WBC 10.7 8.5  HGB 12.2 11.5*  PLT 229 234  CREATININE 1.31* 1.28*   Estimated Creatinine Clearance: 24.8 mL/min (by C-G formula based on Cr of 1.28). No results for input(s): VANCOTROUGH, VANCOPEAK, VANCORANDOM, GENTTROUGH, GENTPEAK, GENTRANDOM, TOBRATROUGH, TOBRAPEAK, TOBRARND, AMIKACINPEAK, AMIKACINTROU, AMIKACIN in the last 72 hours.   Microbiology: Recent Results (from the past 720 hour(s))  Culture, blood (routine x 2)     Status: None (Preliminary result)   Collection Time: 01/17/15  3:50 PM  Result Value Ref Range Status   Specimen Description BLOOD RIGHT AC  Final   Special Requests BOTTLES DRAWN AEROBIC AND ANAEROBIC  12CC  Final   Culture NO GROWTH < 24 HOURS  Final   Report Status PENDING  Incomplete  Culture, blood (routine x 2)     Status: None (Preliminary result)   Collection Time: 01/17/15  3:50 PM  Result Value Ref Range Status   Specimen Description BLOOD LEFT AC  Final   Special Requests   Final    BOTTLES DRAWN AEROBIC AND ANAEROBIC  AER 6CC ANA 11CC   Culture NO GROWTH < 24 HOURS  Final   Report Status PENDING  Incomplete  MRSA PCR Screening     Status: None   Collection Time: 01/17/15  6:04 PM  Result Value Ref Range Status   MRSA by PCR NEGATIVE NEGATIVE Final    Comment:        The GeneXpert MRSA Assay (FDA approved for NASAL specimens only), is one component of a comprehensive MRSA colonization surveillance  program. It is not intended to diagnose MRSA infection nor to guide or monitor treatment for MRSA infections.     Medical History: Past Medical History  Diagnosis Date  . Coronary artery disease 1/98  . Diabetes mellitus     Type II  . Depression   . GERD (gastroesophageal reflux disease)   . Hypertension   . Thyroid disease     Hyperthyroidism  . Osteoporosis   . Allergic rhinitis   . Pacemaker 01/26/2010    new dual-chamber     Medications:  Anti-infectives    Start     Dose/Rate Route Frequency Ordered Stop   01/19/15 1600  levofloxacin (LEVAQUIN) tablet 500 mg  Status:  Discontinued     500 mg Oral Every 48 hours 01/17/15 1550 01/18/15 0908   01/19/15 1600  levofloxacin (LEVAQUIN) tablet 750 mg     750 mg Oral Every 48 hours 01/18/15 0908     01/17/15 1600  levofloxacin (LEVAQUIN) tablet 750 mg     750 mg Oral  Once 01/17/15 1548 01/17/15 1632   01/17/15 1530  levofloxacin (LEVAQUIN) tablet 500 mg  Status:  Discontinued     500 mg Oral Daily 01/17/15 1529 01/17/15 1548     Assessment: Pharmacy consult entered to dose levofloxacin for possible pneumonia in this 81 year old female.  Plan:  Based on renal function, will change to Levofloxacin 750  mg PO q48 hours.   Rande Roylance D 01/18/2015,9:08 AM

## 2015-01-18 NOTE — Plan of Care (Signed)
Problem: Discharge Progression Outcomes Goal: Other Discharge Outcomes/Goals Outcome: Progressing Pt alert disoriented to date and time, form [facility]. C/O generalized weakness, headache, and nausea. Prn medications given with relief. Up to bedside commode x1 assist.

## 2015-01-18 NOTE — Plan of Care (Signed)
Problem: Discharge Progression Outcomes Goal: Other Discharge Outcomes/Goals Outcome: Progressing Plan of care progress to goal for: 1. Pain-no c/o pain, pt resting comfortably in bed 2. Hemodynamically-             -VSS, afebrile 3. Complications-no evidence of this shift 4. Diet-pt had poor po in take this shift 5. [facility]

## 2015-01-18 NOTE — Evaluation (Signed)
Physical Therapy Evaluation Patient Details Name: Joanna Young MRN: [id number] DOB: [birth year] Today's Date: 01/18/2015   History of Present Illness  Pt here with weakness and dizziness  Clinical Impression  Pt is confused and inconsistent t/o the session.  She reports how her dizziness has always been a problem and that it is brand new.  She reports how she is never sick but has had "every sickness there is." Pt unable to report where she lives or if she uses a walker but she has definite need of AD or HHA to maintain balance.  Pt reports vague light-headed dizziness, but does not appear to have vertigo or nystagmus.     Follow Up Recommendations Home health PT    Equipment Recommendations   (pt does not know if she has a walker, does need one)    Recommendations for Other Services       Precautions / Restrictions Precautions Precautions: Fall Restrictions Weight Bearing Restrictions: No      Mobility  Bed Mobility Overal bed mobility: Modified Independent             General bed mobility comments: Pt needing extra cuing and heavy UE use on rails  Transfers Overall transfer level: Modified independent Equipment used: Rolling walker (2 wheeled)             General transfer comment: Pt attempted to get up w/o AD, she was not steady and needed to try again with walker to steady herself in upright  Ambulation/Gait Ambulation/Gait assistance: Min assist Ambulation Distance (Feet): 40 Feet Assistive device: Rolling walker (2 wheeled)       General Gait Details: Pt needed UE support to maintain balance.  She had 2 small LOBs when she tried to walk w/o AD.  Pt fatigued after minimal ambulation.  Stairs            Wheelchair Mobility    Modified Rankin (Stroke Patients Only)       Balance                                             Pertinent Vitals/Pain Pain Assessment: No/denies pain    Home Living Family/patient expects to be  discharged to:: Assisted living                 Additional Comments: Pt unable to give consistent history and prior level of function    Prior Function           Comments: Pt reports that she doesn't need a walker, unsure how true this is     Hand Dominance        Extremity/Trunk Assessment   Upper Extremity Assessment: Generalized weakness (pt inconsistent with following testing)           Lower Extremity Assessment: Generalized weakness (inconsistent following instruction)         Communication   Communication:  (confused)  Cognition Arousal/Alertness: Awake/alert Behavior During Therapy: Restless Overall Cognitive Status: History of cognitive impairments - at baseline                      General Comments      Exercises        Assessment/Plan    PT Assessment Patient needs continued PT services  PT Diagnosis Difficulty walking;Generalized weakness   PT Problem List Decreased strength;Decreased activity tolerance;Decreased  mobility;Decreased balance;Decreased coordination;Decreased cognition;Decreased knowledge of use of DME;Decreased safety awareness  PT Treatment Interventions DME instruction;Gait training;Therapeutic activities;Functional mobility training;Therapeutic exercise;Balance training;Neuromuscular re-education   PT Goals (Current goals can be found in the Care Plan section) Acute Rehab PT Goals Patient Stated Goal: unable to state PT Goal Formulation: With patient Time For Goal Achievement: 02/01/15 Potential to Achieve Goals: Fair    Frequency Min 2X/week   Barriers to discharge        Co-evaluation               End of Session Equipment Utilized During Treatment: Gait belt Activity Tolerance: Patient tolerated treatment well (confusion makes some assessment difficult) Patient left: with bed alarm set      Functional Assessment Tool Used: clinical judgement Functional Limitation: Mobility: Walking and  moving around Mobility: Walking and Moving Around Current Status (M0102): At least 40 percent but less than 60 percent impaired, limited or restricted Mobility: Walking and Moving Around Goal Status [phone]): At least 20 percent but less than 40 percent impaired, limited or restricted    Time: 6440-3474 PT Time Calculation (min) (ACUTE ONLY): 23 min   Charges:   PT Evaluation $Initial PT Evaluation Tier I: 1 Procedure     PT G Codes:   PT G-Codes **NOT FOR INPATIENT CLASS** Functional Assessment Tool Used: clinical judgement Functional Limitation: Mobility: Walking and moving around Mobility: Walking and Moving Around Current Status (Q5956): At least 40 percent but less than 60 percent impaired, limited or restricted Mobility: Walking and Moving Around Goal Status [phone]): At least 20 percent but less than 40 percent impaired, limited or restricted   Loran Senters, PT, DPT [phone]  Malachi Pro 01/18/2015, 1:32 PM

## 2015-01-18 NOTE — Progress Notes (Signed)
[facility] [place] [facility]   PATIENT NAME: Joanna KassRuth Young    MR#:  [id number]  DATE OF BIRTH:  [birth year]  SUBJECTIVE:  CHIEF COMPLAINT:   Chief Complaint  Patient presents with  . Weakness    Feels the same. Main concern is dizziness and generalized weakness. Ambulates independently at baseline. Afebrile. No shortness of breath. Mild dry cough. REVIEW OF SYSTEMS:    Review of Systems  Constitutional: Positive for malaise/fatigue. Negative for fever and chills.  HENT: Negative for sore throat.   Eyes: Negative for blurred vision, double vision and pain.  Respiratory: Positive for cough. Negative for hemoptysis, shortness of breath and wheezing.   Cardiovascular: Negative for chest pain, palpitations, orthopnea and leg swelling.  Gastrointestinal: Negative for heartburn, nausea, vomiting, abdominal pain, diarrhea and constipation.  Genitourinary: Negative for dysuria and hematuria.  Musculoskeletal: Negative for back pain and joint pain.  Skin: Negative for rash.  Neurological: Positive for dizziness and weakness. Negative for sensory change, speech change, focal weakness and headaches.  Endo/Heme/Allergies: Does not bruise/bleed easily.  Psychiatric/Behavioral: Negative for depression. The patient is not nervous/anxious.       DRUG ALLERGIES:  No Known Allergies  VITALS:  Blood pressure 162/58, pulse 78, temperature 98.1 F (36.7 C), temperature source Oral, resp. rate 18, height 5\' 1"  (1.549 m), weight 57.607 kg (127 lb), SpO2 94 %.  PHYSICAL EXAMINATION:   Physical Exam  GENERAL:  81 y.o.-year-old patient lying in the bed with no acute distress.  EYES: Pupils equal, round, reactive to light and accommodation. No scleral icterus. Extraocular muscles intact.  HEENT: Head atraumatic, normocephalic. Oropharynx and nasopharynx clear.  NECK:  Supple, no jugular venous distention. No thyroid enlargement, no tenderness.  LUNGS: Normal breath  sounds bilaterally, no wheezing, rales, rhonchi. No use of accessory muscles of respiration.  CARDIOVASCULAR: S1, S2 normal. No murmurs, rubs, or gallops.  ABDOMEN: Soft, nontender, nondistended. Bowel sounds present. No organomegaly or mass.  EXTREMITIES: No cyanosis, clubbing or edema b/l.    NEUROLOGIC: Cranial nerves II through XII are intact. No focal Motor or sensory deficits b/l.   PSYCHIATRIC: The patient is alert and oriented x 3.  SKIN: No obvious rash, lesion, or ulcer.    LABORATORY PANEL:   CBC  Recent Labs Lab 01/18/15 0351  WBC 8.5  HGB 11.5*  HCT 35.4  PLT 234   ------------------------------------------------------------------------------------------------------------------  Chemistries   Recent Labs Lab 01/17/15 1550 01/18/15 0351  NA  --  137  K  --  4.1  CL  --  110  CO2  --  23  GLUCOSE  --  160*  BUN  --  25*  CREATININE  --  1.28*  CALCIUM  --  8.6*  MG 1.7  --    ------------------------------------------------------------------------------------------------------------------  Cardiac Enzymes  Recent Labs Lab 01/18/15 0351  TROPONINI <0.03   ------------------------------------------------------------------------------------------------------------------  RADIOLOGY:  Ct Head Wo Contrast  01/17/2015  CLINICAL DATA:  Weakness, dizziness, and difficulty seeing of the right eye. EXAM: CT HEAD WITHOUT CONTRAST TECHNIQUE: Contiguous axial images were obtained from the base of the skull through the vertex without intravenous contrast. COMPARISON:  None FINDINGS: There is mild, age-appropriate cerebral atrophy. There is no evidence of acute cortical infarct, intracranial hemorrhage, mass, midline shift, or extra-axial fluid collection. Visualized orbits are unremarkable. Small amount of secretions are noted in the left sphenoid sinus. There are small bilateral mastoid effusions. Calcified atherosclerosis is noted at the skullbase. IMPRESSION: No  evidence of  acute intracranial abnormality. Unremarkable CT appearance of the brain for age. Electronically Signed   By: Sebastian Ache M.D.   On: 01/17/2015 12:17   Dg Chest Port 1 View  01/17/2015  CLINICAL DATA:  Weakness EXAM: PORTABLE CHEST 1 VIEW COMPARISON:  None. FINDINGS: Right subclavian dual lead pacemaker device. Moderate cardiomegaly. Bilateral central and basilar airspace disease. Bilateral pleural effusions. No pneumothorax. IMPRESSION: Cardiomegaly with bilateral airspace disease and bilateral effusions. Electronically Signed   By: Jolaine Click M.D.   On: 01/17/2015 11:25     ASSESSMENT AND PLAN:   Joanna Young is a 81 y.o. female with a known history of coronary artery disease, diabetes mellitus, depression, dementia, heart block status post pacemaker presents to the hospital secondary to generalized weakness.  # Generalized weakness with dizziness- no local motor/sensory on exam. -CT of the head with no acute findings. Has a pacemaker, so cannot get an MRI.  - Ordered a 24-hour follow-up CT scan of the head. -Urine analysis with no evidence of any infection.  - TSH, magnesium and ammonia levels - normal -Physical therapy consult ordered  - Possible vertigo and will start meclizine.  # Bilateral pneumonitis On Levaquin. Afebrile and not needing oxygen.  # hypertension-continue home medications. Patient on Norvasc, losartan and hydrochlorothiazide  # diabetes mellitus-on glipizide. Also started sliding scale insulin  # hypothyroidism-check TSH, continue her Synthroid.  # chronic systolic CHF. Doesn't seem to be fluid overloaded.  # DVT prophylaxis-on Lovenox    All the records are reviewed and case discussed with Care Management/Social Workerr. Management plans discussed with the patient, family and they are in agreement.  CODE STATUS: FULL  DVT Prophylaxis: SCDs  TOTAL TIME TAKING CARE OF THIS PATIENT: 30 minutes.   POSSIBLE D/C IN 1-2 DAYS, DEPENDING ON  CLINICAL CONDITION.   Milagros Loll [name] on 01/18/2015 at 9:50 AM  Between 7am to 6pm - Pager - [phone]  After 6pm go to [email or web address] - password [email or web address]  [facility] [facility]  Office  [phone]  CC: Primary care physician; No primary care provider on file.    Note: This dictation was prepared with Dragon dictation along with smaller phrase technology. Any transcriptional errors that result from this process are unintentional.

## 2015-01-19 ENCOUNTER — Observation Stay: Payer: Medicare Other

## 2015-01-19 DIAGNOSIS — R531 Weakness: Secondary | ICD-10-CM | POA: Diagnosis not present

## 2015-01-19 LAB — CREATININE, SERUM
Creatinine, Ser: 1.25 mg/dL — ABNORMAL HIGH (ref 0.44–1.00)
GFR calc Af Amer: 43 mL/min — ABNORMAL LOW
GFR calc non Af Amer: 37 mL/min — ABNORMAL LOW

## 2015-01-19 LAB — GLUCOSE, CAPILLARY
GLUCOSE-CAPILLARY: 370 mg/dL — AB (ref 65–99)
Glucose-Capillary: 113 mg/dL — ABNORMAL HIGH (ref 65–99)

## 2015-01-19 NOTE — Clinical Social Work Note (Signed)
Clinical Social Work Assessment  Patient Details  Name: Joanna Young A Housh MRN: [id number] Date of Birth: 06/03/1924  Date of referral:  01/19/15               Reason for consult:  Facility Placement                Permission sought to share information with:  Facility Medical sales representativeContact Representative, Family Supports Permission granted to share information::  Yes, Verbal Permission Granted  Name::        Agency::  [facility]  Relationship::  Robin or Harry/adult children   Contact Information:     Housing/Transportation Living arrangements for the past 2 months:  Assisted Living Facility Source of Information:  Patient, Medical Team Patient Interpreter Needed:  None Criminal Activity/Legal Involvement Pertinent to Current Situation/Hospitalization:  No - Comment as needed Significant Relationships:  Adult Children, Community Support Lives with:  Facility Resident Do you feel safe going back to the place where you live?  Yes Need for family participation in patient care:  Yes (Comment)  Care giving concerns: [facility].    Social Worker assessment / plan:  CSW   Employment status:  Retired Health and safety inspectornsurance information:  Armed forces operational officerMedicare, [place] Recommend[place]  Home with Home Health Information / Referral to community resources:     Patient/Family's Response to care:   Pt has been in hospital since Saturday, she states that she is feeling better though still weak.   Patient/Family's Understanding of and Emotional Response to Diagnosis, Current Treatment, and Prognosis: Pt states that she is not used to being sick and feels warn out. Pt's family not in the room at time of CSW visit. CSW left message with Pt's daughter.   Emotional Assessment Appearance:    Attitude/Demeanor/Rapport:   (cooperative; engaged, sad about being sick) Affect (typically observed):  Appropriate, Sad Orientation:    Alcohol / Substance use:    Psych  involvement (Current and /or in the community):  No (Comment)  Discharge Needs  Concerns to be addressed:  Adjustment to Illness Readmission within the last 30 days:  No Current discharge risk:  None Barriers to Discharge:  No Barriers Identified   Ned Cardara N Dally Oshel, LCSW 01/19/2015, 10:09 AM

## 2015-01-19 NOTE — Discharge Summary (Signed)
[facility] [place] [facility]   PATIENT NAME: Joanna Young Girgis    MR#:  [id number]  DATE OF BIRTH:  January 30, 1925  DATE OF ADMISSION:  01/17/2015 ADMITTING PHYSICIAN: Enid Baasadhika Kalisetti, MD  DATE OF DISCHARGE: 01/19/2015  PRIMARY CARE PHYSICIAN: Bari EdwardLaura Berglund, MD    ADMISSION DIAGNOSIS:  Weakness [R53.1] DISCHARGE DIAGNOSIS:  Active Problems:   Weakness  SECONDARY DIAGNOSIS:   Past Medical History  Diagnosis Date  . Coronary artery disease 1/98  . Diabetes mellitus     Type II  . Depression   . GERD (gastroesophageal reflux disease)   . Hypertension   . Thyroid disease     Hyperthyroidism  . Osteoporosis   . Allergic rhinitis   . Pacemaker 01/26/2010    new dual-chamber    HOSPITAL COURSE:  Joanna Young Emert is a 81 y.o. female with a known history of coronary artery disease, diabetes mellitus, depression, dementia, heart block status post pacemaker admitted to the hospital secondary to generalized weakness. Please see Dr. Janalyn HarderKallisetti's dictated history and physical for further details.  Extensive workup was performed including CT scan of the head.  Blood cultures, chest x-ray, which were all essentially negative.  This was thought to be likely just advancing dementia and age.  She was evaluated by physical therapy and was recommended home with home health physical therapy was set up by care management.  She is being discharged back to her facility in stable condition.  Her discharge plans were discussed with her daughter who has healthcare power of attorney and her CODE STATUS was changed to DO NOT RESUSCITATE as per her wishes. DISCHARGE CONDITIONS:  fair CONSULTS OBTAINED:   NONE DRUG ALLERGIES:  No Known Allergies DISCHARGE MEDICATIONS:   Current Discharge Medication List    CONTINUE these medications which have NOT CHANGED   Details  acetaminophen (TYLENOL) 325 MG tablet Take 650 mg by mouth every 6 (six) hours as needed for mild pain or moderate  pain.     amLODipine (NORVASC) 5 MG tablet Take 5 mg by mouth at bedtime.    Associated Diagnoses: Unspecified essential hypertension    aspirin 81 MG chewable tablet Chew 81 mg by mouth daily.    Cholecalciferol (VITAMIN D3) 400 UNITS tablet Take 800 Units by mouth daily.    fluticasone (FLONASE) 50 MCG/ACT nasal spray Place 1 spray into both nostrils daily.    glipiZIDE (GLUCOTROL XL) 5 MG 24 hr tablet Take 5 mg by mouth daily.    levothyroxine (SYNTHROID, LEVOTHROID) 112 MCG tablet Take 112 mcg by mouth daily before breakfast.    loratadine (CLARITIN) 10 MG tablet Take 10 mg by mouth daily as needed for allergies.     losartan-hydrochlorothiazide (HYZAAR) 100-12.5 MG per tablet Take 1 tablet by mouth daily.      sertraline (ZOLOFT) 50 MG tablet Take 50 mg by mouth at bedtime.    promethazine (PHENERGAN) 12.5 MG tablet Take 12.5 mg by mouth every 4 (four) hours as needed. For nausea/vomiting.        DISCHARGE INSTRUCTIONS:   DIET:  Regular diet DISCHARGE CONDITION:  Good ACTIVITY:  Activity as tolerated OXYGEN:  Home Oxygen: No.  Oxygen Delivery: room air DISCHARGE LOCATION:  [facility]  If you experience worsening of your admission symptoms, develop shortness of breath, life threatening emergency, suicidal or homicidal thoughts you must seek medical attention immediately by calling 911 or calling your MD immediately  if symptoms less severe.  You Must read complete instructions/literature  along with all the possible adverse reactions/side effects for all the Medicines you take and that have been prescribed to you. Take any new Medicines after you have completely understood and accpet all the possible adverse reactions/side effects.   Please note  You were cared for by a hospitalist during your hospital stay. If you have any questions about your discharge medications or the care you received while you were in the hospital after you are discharged, you can  call the unit and asked to speak with the hospitalist on call if the hospitalist that took care of you is not available. Once you are discharged, your primary care physician will handle any further medical issues. Please note that NO REFILLS for any discharge medications will be authorized once you are discharged, as it is imperative that you return to your primary care physician (or establish a relationship with a primary care physician if you do not have one) for your aftercare needs so that they can reassess your need for medications and monitor your lab values.    On the day of Discharge:  VITAL SIGNS:  Blood pressure 127/63, pulse 89, temperature 98.5 F (36.9 C), temperature source Oral, resp. rate 20, height  (1.549 m), weight 57.607 kg (127 lb), SpO2 96 %. I/O:   Intake/Output Summary (Last 24 hours) at 01/19/15 0916 Last data filed at 01/18/15 1733  Gross per 24 hour  Intake    240 ml  Output   1000 ml  Net   -760 ml   PHYSICAL EXAMINATION:  GENERAL: 81 y.o.-year-old patient lying in the bed with no acute distress.  EYES: Pupils equal, round, reactive to light and accommodation. No scleral icterus. Extraocular muscles intact.  HEENT: Head atraumatic, normocephalic. Oropharynx and nasopharynx clear.  Left ear tympanic membrane is opaque. Positive for hearing loss NECK: Supple, no jugular venous distention. No thyroid enlargement, no tenderness.  LUNGS: Normal breath sounds bilaterally, no wheezing, rales,rhonchi or crepitation. No use of accessory muscles of respiration. Decreased bibasilar breath sounds CARDIOVASCULAR: S1, S2 normal. No rubs, or gallops. 3/6 systolic murmur is present  ABDOMEN: Soft, nontender, nondistended. Bowel sounds present. No organomegaly or mass.  EXTREMITIES: No pedal edema, cyanosis, or clubbing.  NEUROLOGIC: Cranial nerves II through XII are intact. Muscle strength 5/5 in all extremities. Sensation intact. Gait not checked.  Tremor of  head noted- chronic. PSYCHIATRIC: The patient is alert and oriented x 1.  SKIN: No obvious rash, lesion, or ulcer.  HAIR: the distal tips are more orange, where as her proximal hair strand color is white DATA REVIEW:   CBC  Recent Labs Lab 01/18/15 0351  WBC 8.5  HGB 11.5*  HCT 35.4  PLT 234    Chemistries   Recent Labs Lab 01/17/15 1550 01/18/15 0351  NA  --  137  K  --  4.1  CL  --  110  CO2  --  23  GLUCOSE  --  160*  BUN  --  25*  CREATININE  --  1.28*  CALCIUM  --  8.6*  MG 1.7  --     RADIOLOGY:  Dg Chest 2 View  01/18/2015  CLINICAL DATA:  Pneumonia, CHF, coronary artery disease, diabetes mellitus, dementia, hypertension EXAM: CHEST  2 VIEW COMPARISON:  01/17/2015 FINDINGS: RIGHT subclavian transvenous pacemaker leads project at RIGHT atrium and RIGHT ventricle. Upper normal heart size. Mediastinal contours and pulmonary vascularity normal. Lungs slightly hyperinflated with interstitial infiltrates at the lateral aspect of the mid to  lower LEFT lung and minimally at the lateral RIGHT lung base. These could represent acute interstitial infection or edema or be related to chronic interstitial lung disease/fibrosis. Basilar atelectasis on previous exam improved. No definite pleural effusion or pneumothorax. Bones demineralized. IMPRESSION: Mild interstitial infiltrates at the periphery of the lower lungs greater on LEFT, question acute interstitial infection versus edema or chronic interstitial disease/fibrosis. Improved bibasilar atelectasis since prior study. Electronically Signed   By: Ulyses Southward M.D.   On: 01/18/2015 14:17   Ct Head Wo Contrast  01/18/2015  CLINICAL DATA:  Dizziness, followup possible stroke EXAM: CT HEAD WITHOUT CONTRAST TECHNIQUE: Contiguous axial images were obtained from the base of the skull through the vertex without intravenous contrast. COMPARISON:  01/17/2015 FINDINGS: Mild generalized atrophy. Normal ventricular morphology. No midline  shift or mass effect. Few streak artifacts at skull base. Otherwise normal appearance of brain parenchyma. No intracranial hemorrhage, mass lesion or evidence acute infarction. No extra-axial fluid collections. Small amount of fluid or mucus within a locule of the LEFT sphenoid sinus. Visualized paranasal sinuses and mastoid air cells otherwise clear. No acute osseous findings. IMPRESSION: No acute intracranial abnormalities. No interval change. Electronically Signed   By: Ulyses Southward M.D.   On: 01/18/2015 12:51   Ct Head Wo Contrast  01/17/2015  CLINICAL DATA:  Weakness, dizziness, and difficulty seeing of the right eye. EXAM: CT HEAD WITHOUT CONTRAST TECHNIQUE: Contiguous axial images were obtained from the base of the skull through the vertex without intravenous contrast. COMPARISON:  None FINDINGS: There is mild, age-appropriate cerebral atrophy. There is no evidence of acute cortical infarct, intracranial hemorrhage, mass, midline shift, or extra-axial fluid collection. Visualized orbits are unremarkable. Small amount of secretions are noted in the left sphenoid sinus. There are small bilateral mastoid effusions. Calcified atherosclerosis is noted at the skullbase. IMPRESSION: No evidence of acute intracranial abnormality. Unremarkable CT appearance of the brain for age. Electronically Signed   By: Sebastian Ache M.D.   On: 01/17/2015 12:17   Dg Chest Port 1 View  01/17/2015  CLINICAL DATA:  Weakness EXAM: PORTABLE CHEST 1 VIEW COMPARISON:  None. FINDINGS: Right subclavian dual lead pacemaker device. Moderate cardiomegaly. Bilateral central and basilar airspace disease. Bilateral pleural effusions. No pneumothorax. IMPRESSION: Cardiomegaly with bilateral airspace disease and bilateral effusions. Electronically Signed   By: Jolaine Click M.D.   On: 01/17/2015 11:25     Management plans discussed with the patient, family and they are in agreement.  I have discussed this with patient's daughter who has  healthcare power of attorney and she is agreeable with updating the CODE STATUS.  CODE STATUS: DO NOT RESUSCITATE  TOTAL TIME TAKING CARE OF THIS PATIENT: 55 minutes.    [facility], Calem Cocozza M.D on 01/19/2015 at 9:16 AM  Between 7am to 6pm - Pager - [phone]  After 6pm go to [email or web address] - password [email or web address] [facility]  [place] [facility]  Office  [phone]  CC: Primary care physician; Bari Edward, MD

## 2015-01-19 NOTE — Progress Notes (Signed)
CSW was notified that Joanna Young has been cleared [facility]. Joanna Young contacted RN at ALF who came by to visit Joanna Young and speak with nursing as part of their protocol before a patient can return. [facility]   Wilford Gristara Taelyn Nemes, LCSW [phone]

## 2015-01-19 NOTE — Progress Notes (Signed)
Pt to be discharged back to [facility]. Alert. No resp distress.  Sl d/cd.  Packet to  Facility caregiver and discharge explained.  Verbalized understanding.  Home  Via w/c at  1330 w/c c/o.

## 2015-01-19 NOTE — Discharge Instructions (Signed)
Dizziness °Dizziness is a common problem. It makes you feel unsteady or lightheaded. You may feel like you are about to pass out (faint). Dizziness can lead to injury if you stumble or fall. Anyone can get dizzy, but dizziness is more common in older adults. This condition can be caused by a number of things, including: °· Medicines. °· Dehydration. °· Illness. °HOME CARE °Following these instructions may help with your condition: °Eating and Drinking °· Drink enough fluid to keep your pee (urine) clear or pale yellow. This helps to keep you from getting dehydrated. Try to drink more clear fluids, such as water. °· Do not drink alcohol. °· Limit how much caffeine you drink or eat if told by your doctor. °· Limit how much salt you drink or eat if told by your doctor. °Activity °· Avoid making quick movements. °¨ When you stand up from sitting in a chair, steady yourself until you feel okay. °¨ In the morning, first sit up on the side of the bed. When you feel okay, stand slowly while you hold onto something. Do this until you know that your balance is fine. °· Move your legs often if you need to stand in one place for a long time. Tighten and relax your muscles in your legs while you are standing. °· Do not drive or use heavy machinery if you feel dizzy. °· Avoid bending down if you feel dizzy. Place items in your home so that they are easy for you to reach without leaning over. °Lifestyle °· Do not use any tobacco products, including cigarettes, chewing tobacco, or electronic cigarettes. If you need help quitting, ask your doctor. °· Try to lower your stress level, such as with yoga or meditation. Talk with your doctor if you need help. °General Instructions °· Watch your dizziness for any changes. °· Take medicines only as told by your doctor. Talk with your doctor if you think that your dizziness is caused by a medicine that you are taking. °· Tell a friend or a family member that you are feeling dizzy. If he or  she notices any changes in your behavior, have this person call your doctor. °· Keep all follow-up visits as told by your doctor. This is important. °GET HELP IF: °· Your dizziness does not go away. °· Your dizziness or light-headedness gets worse. °· You feel sick to your stomach (nauseous). °· You have trouble hearing. °· You have new symptoms. °· You are unsteady on your feet or you feel like the room is spinning. °GET HELP RIGHT AWAY IF: °· You throw up (vomit) or have diarrhea and are unable to eat or drink anything. °· You have trouble: °¨ Talking. °¨ Walking. °¨ Swallowing. °¨ Using your arms, hands, or legs. °· You feel generally weak. °· You are not thinking clearly or you have trouble forming sentences. It may take a friend or family member to notice this. °· You have: °¨ Chest pain. °¨ Pain in your belly (abdomen). °¨ Shortness of breath. °¨ Sweating. °· Your vision changes. °· You are bleeding. °· You have a headache. °· You have neck pain or a stiff neck. °· You have a fever. °  °This information is not intended to replace advice given to you by your health care provider. Make sure you discuss any questions you have with your health care provider. °  °Document Released: 03/03/2011 Document Revised: 07/29/2014 Document Reviewed: 03/10/2014 °Elsevier Interactive Patient Education ©2016 [employer] ° °

## 2015-01-19 NOTE — Plan of Care (Signed)
Problem: Discharge Progression Outcomes Goal: Activity appropriate for discharge plan Outcome: Adequate for Discharge amb with assist to bathroom

## 2015-01-19 NOTE — Plan of Care (Addendum)
Problem: Discharge Progression Outcomes Goal: Discharge plan in place and appropriate [facility] from [facility]. C/O generalized weakness and dizziness. Up to [facility] x1 assist. At baseline pt can complete all ADLs on her own. She is hard of hearing and family states she does have a history of dementia.Fluids infusing, po abx and prn pain and nausea meds given with relief  1. No c/o pain, resting comfortably in bed throughout the night.   2. Hemodynamically: VSS, afebrile, c/o dizziness w/ sitting up in bed at times  3. Tolerating heart healthy diet, poor appetite though 4. Moderate fall risk requiring [facility].

## 2015-01-22 LAB — CULTURE, BLOOD (ROUTINE X 2)
CULTURE: NO GROWTH
Culture: NO GROWTH

## 2016-05-09 ENCOUNTER — Emergency Department: Payer: Medicare Other

## 2016-05-09 ENCOUNTER — Inpatient Hospital Stay
Admission: EM | Admit: 2016-05-09 | Discharge: 2016-05-26 | DRG: 871 | Disposition: E | Payer: Medicare Other | Attending: Internal Medicine | Admitting: Internal Medicine

## 2016-05-09 ENCOUNTER — Encounter: Payer: Self-pay | Admitting: Emergency Medicine

## 2016-05-09 ENCOUNTER — Inpatient Hospital Stay: Payer: Medicare Other

## 2016-05-09 DIAGNOSIS — I480 Paroxysmal atrial fibrillation: Secondary | ICD-10-CM | POA: Diagnosis present

## 2016-05-09 DIAGNOSIS — E1165 Type 2 diabetes mellitus with hyperglycemia: Secondary | ICD-10-CM | POA: Diagnosis present

## 2016-05-09 DIAGNOSIS — Z90710 Acquired absence of both cervix and uterus: Secondary | ICD-10-CM | POA: Diagnosis not present

## 2016-05-09 DIAGNOSIS — I959 Hypotension, unspecified: Secondary | ICD-10-CM | POA: Diagnosis present

## 2016-05-09 DIAGNOSIS — N289 Disorder of kidney and ureter, unspecified: Secondary | ICD-10-CM

## 2016-05-09 DIAGNOSIS — Z950 Presence of cardiac pacemaker: Secondary | ICD-10-CM | POA: Diagnosis not present

## 2016-05-09 DIAGNOSIS — R7989 Other specified abnormal findings of blood chemistry: Secondary | ICD-10-CM

## 2016-05-09 DIAGNOSIS — I248 Other forms of acute ischemic heart disease: Secondary | ICD-10-CM | POA: Diagnosis present

## 2016-05-09 DIAGNOSIS — I10 Essential (primary) hypertension: Secondary | ICD-10-CM | POA: Diagnosis present

## 2016-05-09 DIAGNOSIS — R0902 Hypoxemia: Secondary | ICD-10-CM

## 2016-05-09 DIAGNOSIS — F0390 Unspecified dementia without behavioral disturbance: Secondary | ICD-10-CM | POA: Diagnosis present

## 2016-05-09 DIAGNOSIS — F329 Major depressive disorder, single episode, unspecified: Secondary | ICD-10-CM | POA: Diagnosis present

## 2016-05-09 DIAGNOSIS — G9341 Metabolic encephalopathy: Secondary | ICD-10-CM | POA: Diagnosis present

## 2016-05-09 DIAGNOSIS — Z7189 Other specified counseling: Secondary | ICD-10-CM | POA: Diagnosis not present

## 2016-05-09 DIAGNOSIS — Z9049 Acquired absence of other specified parts of digestive tract: Secondary | ICD-10-CM

## 2016-05-09 DIAGNOSIS — J9600 Acute respiratory failure, unspecified whether with hypoxia or hypercapnia: Secondary | ICD-10-CM | POA: Diagnosis present

## 2016-05-09 DIAGNOSIS — I2510 Atherosclerotic heart disease of native coronary artery without angina pectoris: Secondary | ICD-10-CM | POA: Diagnosis present

## 2016-05-09 DIAGNOSIS — J810 Acute pulmonary edema: Secondary | ICD-10-CM | POA: Diagnosis not present

## 2016-05-09 DIAGNOSIS — Z7982 Long term (current) use of aspirin: Secondary | ICD-10-CM | POA: Diagnosis not present

## 2016-05-09 DIAGNOSIS — Z66 Do not resuscitate: Secondary | ICD-10-CM | POA: Diagnosis present

## 2016-05-09 DIAGNOSIS — A419 Sepsis, unspecified organism: Secondary | ICD-10-CM | POA: Diagnosis present

## 2016-05-09 DIAGNOSIS — R778 Other specified abnormalities of plasma proteins: Secondary | ICD-10-CM

## 2016-05-09 DIAGNOSIS — Y95 Nosocomial condition: Secondary | ICD-10-CM | POA: Diagnosis present

## 2016-05-09 DIAGNOSIS — N179 Acute kidney failure, unspecified: Secondary | ICD-10-CM | POA: Diagnosis present

## 2016-05-09 DIAGNOSIS — Z7984 Long term (current) use of oral hypoglycemic drugs: Secondary | ICD-10-CM

## 2016-05-09 DIAGNOSIS — R000 Tachycardia, unspecified: Secondary | ICD-10-CM | POA: Diagnosis present

## 2016-05-09 DIAGNOSIS — M810 Age-related osteoporosis without current pathological fracture: Secondary | ICD-10-CM | POA: Diagnosis present

## 2016-05-09 DIAGNOSIS — R739 Hyperglycemia, unspecified: Secondary | ICD-10-CM

## 2016-05-09 DIAGNOSIS — J9601 Acute respiratory failure with hypoxia: Secondary | ICD-10-CM | POA: Diagnosis not present

## 2016-05-09 DIAGNOSIS — E0590 Thyrotoxicosis, unspecified without thyrotoxic crisis or storm: Secondary | ICD-10-CM | POA: Diagnosis present

## 2016-05-09 DIAGNOSIS — Z79899 Other long term (current) drug therapy: Secondary | ICD-10-CM

## 2016-05-09 DIAGNOSIS — J189 Pneumonia, unspecified organism: Secondary | ICD-10-CM | POA: Diagnosis present

## 2016-05-09 DIAGNOSIS — K219 Gastro-esophageal reflux disease without esophagitis: Secondary | ICD-10-CM | POA: Diagnosis present

## 2016-05-09 DIAGNOSIS — Z515 Encounter for palliative care: Secondary | ICD-10-CM | POA: Diagnosis not present

## 2016-05-09 LAB — COMPREHENSIVE METABOLIC PANEL
ALK PHOS: 41 U/L (ref 38–126)
ALT: 8 U/L — ABNORMAL LOW (ref 14–54)
AST: 13 U/L — ABNORMAL LOW (ref 15–41)
Albumin: 2 g/dL — ABNORMAL LOW (ref 3.5–5.0)
BILIRUBIN TOTAL: 0.8 mg/dL (ref 0.3–1.2)
BUN: 18 mg/dL (ref 6–20)
CALCIUM: 6.4 mg/dL — AB (ref 8.9–10.3)
CO2: 16 mmol/L — ABNORMAL LOW (ref 22–32)
Chloride: 101 mmol/L (ref 101–111)
Creatinine, Ser: 1.67 mg/dL — ABNORMAL HIGH (ref 0.44–1.00)
GFR calc non Af Amer: 26 mL/min — ABNORMAL LOW (ref >60)
GFR, EST AFRICAN AMERICAN: 30 mL/min — AB (ref >60)
Glucose, Bld: 1104 mg/dL (ref 65–99)
Potassium: 2.9 mmol/L — ABNORMAL LOW (ref 3.5–5.1)
SODIUM: 108 mmol/L — AB (ref 135–145)
TOTAL PROTEIN: 5.5 g/dL — AB (ref 6.5–8.1)

## 2016-05-09 LAB — BLOOD GAS, ARTERIAL
Acid-base deficit: 5.9 mmol/L — ABNORMAL HIGH (ref 0.0–2.0)
BICARBONATE: 20.2 mmol/L (ref 20.0–28.0)
FIO2: 1
O2 Saturation: 94.4 %
PCO2 ART: 41 mmHg (ref 32.0–48.0)
PH ART: 7.3 — AB (ref 7.350–7.450)
Patient temperature: 37
pO2, Arterial: 80 mmHg — ABNORMAL LOW (ref 83.0–108.0)

## 2016-05-09 LAB — BASIC METABOLIC PANEL
Anion gap: 8 (ref 5–15)
BUN: 23 mg/dL — ABNORMAL HIGH (ref 6–20)
CALCIUM: 8 mg/dL — AB (ref 8.9–10.3)
CO2: 22 mmol/L (ref 22–32)
CREATININE: 1.45 mg/dL — AB (ref 0.44–1.00)
Chloride: 109 mmol/L (ref 101–111)
GFR calc Af Amer: 36 mL/min — ABNORMAL LOW (ref >60)
GFR calc non Af Amer: 31 mL/min — ABNORMAL LOW (ref >60)
GLUCOSE: 214 mg/dL — AB (ref 65–99)
Potassium: 3.7 mmol/L (ref 3.5–5.1)
Sodium: 139 mmol/L (ref 135–145)

## 2016-05-09 LAB — INFLUENZA PANEL BY PCR (TYPE A & B)
Influenza A By PCR: NEGATIVE
Influenza B By PCR: NEGATIVE

## 2016-05-09 LAB — BLOOD GAS, VENOUS
Acid-base deficit: 0.3 mmol/L (ref 0.0–2.0)
BICARBONATE: 24 mmol/L (ref 20.0–28.0)
FIO2: 0.28
O2 SAT: 78.5 %
PATIENT TEMPERATURE: 37
PO2 VEN: 42 mmHg (ref 32.0–45.0)
pCO2, Ven: 37 mmHg — ABNORMAL LOW (ref 44.0–60.0)
pH, Ven: 7.42 (ref 7.250–7.430)

## 2016-05-09 LAB — CBC WITH DIFFERENTIAL/PLATELET
Basophils Absolute: 0.1 10*3/uL (ref 0–0.1)
Basophils Relative: 0 %
Eosinophils Absolute: 0.1 10*3/uL (ref 0–0.7)
Eosinophils Relative: 1 %
HEMATOCRIT: 29.8 % — AB (ref 35.0–47.0)
HEMOGLOBIN: 9.8 g/dL — AB (ref 12.0–16.0)
LYMPHS ABS: 1.3 10*3/uL (ref 1.0–3.6)
LYMPHS PCT: 10 %
MCH: 30.6 pg (ref 26.0–34.0)
MCHC: 32.8 g/dL (ref 32.0–36.0)
MCV: 93.2 fL (ref 80.0–100.0)
MONOS PCT: 8 %
Monocytes Absolute: 1 10*3/uL — ABNORMAL HIGH (ref 0.2–0.9)
NEUTROS ABS: 10.1 10*3/uL — AB (ref 1.4–6.5)
NEUTROS PCT: 81 %
Platelets: 343 10*3/uL (ref 150–440)
RBC: 3.2 MIL/uL — ABNORMAL LOW (ref 3.80–5.20)
RDW: 14.4 % (ref 11.5–14.5)
WBC: 12.5 10*3/uL — ABNORMAL HIGH (ref 3.6–11.0)

## 2016-05-09 LAB — GLUCOSE, CAPILLARY
GLUCOSE-CAPILLARY: 191 mg/dL — AB (ref 65–99)
GLUCOSE-CAPILLARY: 193 mg/dL — AB (ref 65–99)
Glucose-Capillary: 139 mg/dL — ABNORMAL HIGH (ref 65–99)

## 2016-05-09 LAB — URINALYSIS, ROUTINE W REFLEX MICROSCOPIC
Bilirubin Urine: NEGATIVE
Glucose, UA: NEGATIVE mg/dL
HGB URINE DIPSTICK: NEGATIVE
Ketones, ur: NEGATIVE mg/dL
Leukocytes, UA: NEGATIVE
NITRITE: NEGATIVE
PROTEIN: 30 mg/dL — AB
Specific Gravity, Urine: 1.015 (ref 1.005–1.030)
pH: 5 (ref 5.0–8.0)

## 2016-05-09 LAB — APTT: aPTT: 31 seconds (ref 24–36)

## 2016-05-09 LAB — TROPONIN I
TROPONIN I: 0.29 ng/mL — AB (ref <0.03)
Troponin I: 0.36 ng/mL (ref <0.03)

## 2016-05-09 LAB — PROTIME-INR
INR: 1.34
PROTHROMBIN TIME: 16.7 s — AB (ref 11.4–15.2)

## 2016-05-09 LAB — LACTIC ACID, PLASMA: LACTIC ACID, VENOUS: 1.1 mmol/L (ref 0.5–1.9)

## 2016-05-09 LAB — PROCALCITONIN

## 2016-05-09 MED ORDER — ACETAMINOPHEN 650 MG RE SUPP: 650.0000 mg | Freq: Four times a day (QID) | RECTAL | Status: DC | PRN

## 2016-05-09 MED ORDER — SODIUM CHLORIDE 0.9 % IV SOLN
0.0000 ug/min | INTRAVENOUS | Status: DC | PRN
  Filled 2016-05-09: qty 1

## 2016-05-09 MED ORDER — AMIODARONE LOAD VIA INFUSION
150.0000 mg | Freq: Once | INTRAVENOUS | Status: AC
  Administered 2016-05-09: 150 mg via INTRAVENOUS
  Filled 2016-05-09: qty 83.34

## 2016-05-09 MED ORDER — AMIODARONE LOAD VIA INFUSION: 150.0000 mg | Freq: Once | INTRAVENOUS | Status: DC

## 2016-05-09 MED ORDER — DIGOXIN 0.25 MG/ML IJ SOLN
INTRAMUSCULAR | Status: AC
  Administered 2016-05-09: 0.13 mg
  Filled 2016-05-09: qty 2

## 2016-05-09 MED ORDER — HYDROCORTISONE NA SUCCINATE PF 100 MG IJ SOLR
100.0000 mg | Freq: Three times a day (TID) | INTRAMUSCULAR | Status: DC
  Administered 2016-05-09 – 2016-05-10 (×2): 100 mg via INTRAVENOUS
  Filled 2016-05-09 (×2): qty 2

## 2016-05-09 MED ORDER — LORAZEPAM 2 MG/ML IJ SOLN
INTRAMUSCULAR | Status: AC
  Administered 2016-05-09: 1 mg via INTRAVENOUS
  Filled 2016-05-09: qty 1

## 2016-05-09 MED ORDER — LORAZEPAM 2 MG/ML IJ SOLN
0.5000 mg | Freq: Once | INTRAMUSCULAR | Status: AC
  Administered 2016-05-09: 0.5 mg via INTRAVENOUS

## 2016-05-09 MED ORDER — ONDANSETRON HCL 4 MG PO TABS: 4.0000 mg | ORAL_TABLET | Freq: Four times a day (QID) | ORAL | Status: DC | PRN

## 2016-05-09 MED ORDER — CEFEPIME-DEXTROSE 1 GM/50ML IV SOLR: 1.0000 g | Freq: Two times a day (BID) | INTRAVENOUS | Status: DC

## 2016-05-09 MED ORDER — MORPHINE SULFATE (PF) 2 MG/ML IV SOLN
INTRAVENOUS | Status: AC
Start: 2016-05-09 — End: 2016-05-10
  Filled 2016-05-09: qty 1

## 2016-05-09 MED ORDER — SODIUM CHLORIDE 0.9 % IV BOLUS (SEPSIS): 250.0000 mL | Freq: Once | INTRAVENOUS | Status: DC

## 2016-05-09 MED ORDER — VANCOMYCIN HCL IN DEXTROSE 750-5 MG/150ML-% IV SOLN
750.0000 mg | INTRAVENOUS | Status: DC
  Filled 2016-05-09: qty 150

## 2016-05-09 MED ORDER — LORAZEPAM 2 MG/ML IJ SOLN
1.0000 mg | Freq: Four times a day (QID) | INTRAMUSCULAR | Status: DC
  Administered 2016-05-09 – 2016-05-10 (×2): 1 mg via INTRAVENOUS
  Filled 2016-05-09 (×2): qty 1

## 2016-05-09 MED ORDER — ENOXAPARIN SODIUM 30 MG/0.3ML ~~LOC~~ SOLN
30.0000 mg | SUBCUTANEOUS | Status: DC
  Administered 2016-05-09: 30 mg via SUBCUTANEOUS
  Filled 2016-05-09: qty 0.3

## 2016-05-09 MED ORDER — AMIODARONE HCL IN DEXTROSE 360-4.14 MG/200ML-% IV SOLN
30.0000 mg/h | INTRAVENOUS | Status: DC
  Administered 2016-05-09 – 2016-05-10 (×2): 30 mg/h via INTRAVENOUS
  Filled 2016-05-09 (×5): qty 200

## 2016-05-09 MED ORDER — ASPIRIN 81 MG PO CHEW
324.0000 mg | CHEWABLE_TABLET | Freq: Once | ORAL | Status: AC
  Administered 2016-05-09: 324 mg via ORAL
  Filled 2016-05-09: qty 4

## 2016-05-09 MED ORDER — CEFEPIME-DEXTROSE 2 GM/50ML IV SOLR
2.0000 g | Freq: Once | INTRAVENOUS | Status: AC
  Administered 2016-05-09: 2 g via INTRAVENOUS
  Filled 2016-05-09: qty 50

## 2016-05-09 MED ORDER — VANCOMYCIN HCL IN DEXTROSE 750-5 MG/150ML-% IV SOLN
750.0000 mg | INTRAVENOUS | Status: DC
  Administered 2016-05-10: 750 mg via INTRAVENOUS
  Filled 2016-05-09: qty 150

## 2016-05-09 MED ORDER — AMIODARONE IV BOLUS ONLY 150 MG/100ML
INTRAVENOUS | Status: AC
  Filled 2016-05-09: qty 100

## 2016-05-09 MED ORDER — ACETAMINOPHEN 325 MG PO TABS: 650.0000 mg | ORAL_TABLET | Freq: Four times a day (QID) | ORAL | Status: DC | PRN

## 2016-05-09 MED ORDER — VANCOMYCIN HCL IN DEXTROSE 1-5 GM/200ML-% IV SOLN
1000.0000 mg | Freq: Once | INTRAVENOUS | Status: AC
  Administered 2016-05-09: 1000 mg via INTRAVENOUS
  Filled 2016-05-09: qty 200

## 2016-05-09 MED ORDER — SODIUM CHLORIDE 0.9 % IV SOLN: INTRAVENOUS | Status: DC

## 2016-05-09 MED ORDER — ALBUTEROL SULFATE (2.5 MG/3ML) 0.083% IN NEBU: 2.5000 mg | INHALATION_SOLUTION | RESPIRATORY_TRACT | Status: DC | PRN

## 2016-05-09 MED ORDER — DIGOXIN 0.1 MG/ML IJ SOLN
0.1250 mg | Freq: Once | INTRAMUSCULAR | Status: DC
  Filled 2016-05-09: qty 2

## 2016-05-09 MED ORDER — ONDANSETRON HCL 4 MG/2ML IJ SOLN: 4.0000 mg | Freq: Four times a day (QID) | INTRAMUSCULAR | Status: DC | PRN

## 2016-05-09 MED ORDER — DILTIAZEM HCL 25 MG/5ML IV SOLN
INTRAVENOUS | Status: AC
  Administered 2016-05-09: 10 mg
  Filled 2016-05-09: qty 5

## 2016-05-09 MED ORDER — INSULIN ASPART 100 UNIT/ML ~~LOC~~ SOLN
10.0000 [IU] | Freq: Once | SUBCUTANEOUS | Status: AC
Start: 2016-05-09 — End: 2016-05-09
  Administered 2016-05-09: 10 [IU] via INTRAVENOUS
  Filled 2016-05-09: qty 10

## 2016-05-09 MED ORDER — SODIUM CHLORIDE 0.9 % IV SOLN: 0.0000 ug/min | INTRAVENOUS | Status: DC

## 2016-05-09 MED ORDER — IPRATROPIUM-ALBUTEROL 0.5-2.5 (3) MG/3ML IN SOLN
3.0000 mL | Freq: Four times a day (QID) | RESPIRATORY_TRACT | Status: DC
  Administered 2016-05-09 – 2016-05-10 (×3): 3 mL via RESPIRATORY_TRACT
  Filled 2016-05-09 (×3): qty 3

## 2016-05-09 MED ORDER — AMIODARONE HCL IN DEXTROSE 360-4.14 MG/200ML-% IV SOLN
60.0000 mg/h | INTRAVENOUS | Status: AC
  Administered 2016-05-09: 60 mg/h via INTRAVENOUS
  Filled 2016-05-09: qty 200

## 2016-05-09 MED ORDER — CEFEPIME-DEXTROSE 1 GM/50ML IV SOLR: 1.0000 g | INTRAVENOUS | Status: DC

## 2016-05-09 MED ORDER — SODIUM CHLORIDE 0.9 % IV BOLUS (SEPSIS)
500.0000 mL | Freq: Once | INTRAVENOUS | Status: AC
  Administered 2016-05-09 (×2): 500 mL via INTRAVENOUS

## 2016-05-09 MED ORDER — DILTIAZEM HCL 100 MG IV SOLR: 5.0000 mg/h | INTRAVENOUS | Status: DC

## 2016-05-09 MED ORDER — MORPHINE SULFATE (PF) 2 MG/ML IV SOLN
2.0000 mg | INTRAVENOUS | Status: DC | PRN
  Administered 2016-05-09: 2 mg via INTRAVENOUS

## 2016-05-09 MED ORDER — LORAZEPAM 2 MG/ML IJ SOLN
INTRAMUSCULAR | Status: AC
  Administered 2016-05-09: 0.5 mg via INTRAVENOUS
  Filled 2016-05-09: qty 1

## 2016-05-09 MED ORDER — CEFEPIME-DEXTROSE 1 GM/50ML IV SOLR
1.0000 g | INTRAVENOUS | Status: DC
  Filled 2016-05-09: qty 50

## 2016-05-09 MED ORDER — SODIUM CHLORIDE 0.9 % IV BOLUS (SEPSIS)
1000.0000 mL | Freq: Once | INTRAVENOUS | Status: AC
  Administered 2016-05-09: 1000 mL via INTRAVENOUS
  Filled 2016-05-09: qty 1000

## 2016-05-09 NOTE — ED Provider Notes (Addendum)
[facility] Emergency Department Provider Note  ____________________________________________  Time seen: Approximately 1:01 PM  I have reviewed the triage vital signs and the nursing notes.   HISTORY  Chief Complaint Cough    HPI Joanna Young is a 83 y.o. female nursing home resident with a history of chronic ischemic heart disease s/p pacemaker, DM 2, HTN, presenting from her nursing home with lightheadedness with standing, and hypoxia. The patient reports several weeks of a cough but denies any other symptoms including fever, congestion or rhinorrhea, sore throat, ear pain, or shortness of breath. She denies any chest pain or lower external swelling. On arrival to the emergency department, her O2 sats are 87-88% on room air and she does not wear supplemental oxygen at baseline.     Past Medical History:  Diagnosis Date  . Allergic rhinitis   . Coronary artery disease 1/98  . Depression   . Diabetes mellitus    Type II  . GERD (gastroesophageal reflux disease)   . Hypertension   . Osteoporosis   . Pacemaker 01/26/2010   new dual-chamber   . Thyroid disease    Hyperthyroidism    Patient Active Problem List   Diagnosis Date Noted  . Weakness 01/17/2015  . PPM-[employer] 01/12/2010  . CELLULITIS, LEFT LEG 07/21/2008  . ALLERGIC RHINITIS 12/13/2007  . INSOMNIA 04/13/2007  . SINUSITIS, ACUTE 12/06/2006  . DRY EYE SYNDROME 08/11/2006  . HYPERTHYROIDISM 07/05/2006  . DIABETES MELLITUS, TYPE II 07/05/2006  . ANEMIA 07/05/2006  . DEPRESSION 07/05/2006  . HYPERTENSION 07/05/2006  . CORONARY ARTERY DISEASE 07/05/2006  . OSTEOPOROSIS 07/05/2006  . GERD 03/02/2005    Past Surgical History:  Procedure Laterality Date  . ABDOMINAL HYSTERECTOMY  1960's  . CATARACT EXTRACTION  02/14/06   OS  . CHOLECYSTECTOMY  1996  . INSERT / REPLACE / REMOVE PACEMAKER  01/26/2010   new dual chamber pacemaker   . IWMI  1/98  . PACEMAKER PLACEMENT  1998   Permanent  pacemaker  . TONSILLECTOMY  1944  . US ECHOCARDIOGRAPHY  2/07   EF 45% -diast. dys. Fr.  . VAGINAL DELIVERY     x6    Current Outpatient Rx  . Order #: [id number] Class: Historical Med  . Order #: [id number] Class: Historical Med  . Order #: [id number] Class: Historical Med  . Order #: [id number] Class: Historical Med  . Order #: [id number] Class: Historical Med  . Order #: [id number] Class: Historical Med  . Order #: [id number] Class: Historical Med  . Order #: [id number] Class: Historical Med  . Order #: [id number] Class: Historical Med  . Order #: [id number] Class: Historical Med  . Order #: [id number] Class: Historical Med  . Order #: [id number] Class: Historical Med    Allergies Patient has no known allergies.  History reviewed. No pertinent family history.  Social History Social History  Substance Use Topics  . Smoking status: Never Smoker  . Smokeless tobacco: Never Used  . Alcohol use No    Review of Systems Constitutional: No fever/chills.Positive postural lightheadedness. Negative syncope. Eyes: No visual changes. No eye discharge. ENT: No sore throat. No congestion or rhinorrhea. No ear pain. Cardiovascular: Denies chest pain. Denies palpitations. Respiratory: Denies shortness of breath.  Positive cough. Positive hypoxia. Gastrointestinal: No abdominal pain.  No nausea, no vomiting.  No diarrhea.  No constipation. Genitourinary: Negative for dysuria. Musculoskeletal: Negative for back pain. Skin: Negative for rash. Neurological: Negative for headaches. No focal numbness, tingling or weakness.   10-point ROS otherwise negative.  ____________________________________________   PHYSICAL EXAM:  VITAL SIGNS: ED Triage Vitals  Enc Vitals Group     BP April 13, 2016 1147 (!) 157/70     Pulse Rate April 13, 2016 1147 (!) 108     Resp April 13, 2016 1147 20     Temp April 13, 2016 1147 98.2 F (36.8 C)     Temp Source April 13, 2016 1147 Oral     SpO2 April 13, 2016 1147 94 %     Weight April 13, 2016 1148 127 lb (57.6  kg)     Height --      Head Circumference --      Peak Flow --      Pain Score April 13, 2016 1148 0     Pain Loc --      Pain Edu? --      Excl. in GC? --     Constitutional: Patient is alert to person but not year. She is answering questions appropriately. GCS is 15. She is protecting her airway. She is comfortable appearing sitting in the stretcher. Eyes: Conjunctivae are normal.  EOMI. No scleral icterus. No eye discharge. Head: Atraumatic. Nose: No congestion/rhinnorhea. Mouth/Throat: Mucous membranes are moist.  Neck: No stridor.  Supple.  No JVD. No meningismus. Cardiovascular: Normal rate, regular rhythm. No murmurs, rubs or gallops.  Respiratory: Mild tachypnea without accessory muscle use or retractions per the patient has rales in the bases right greater than left. She has fair air exchange. O2 sats are 87% on room air and my examination and bumped to the mid 90s on 2 L nasal cannula. Gastrointestinal: Soft, nontender and nondistended.  No guarding or rebound.  No peritoneal signs. Musculoskeletal: No LE edema. No ttp in the calves or palpable cords.  Negative Homan's sign. Neurologic:  Alert to person but not year.  Speech is clear.  Face and smile are symmetric.  EOMI.  Moves all extremities well. Skin:  Skin is warm, dry and intact. No rash noted. Psychiatric: Mood and affect are normal. Speech and behavior are normal.  Normal judgement.  ____________________________________________   LABS (all labs ordered are listed, but only abnormal results are displayed)  Labs Reviewed  CBC WITH DIFFERENTIAL/PLATELET - Abnormal; Notable for the following:       Result Value   WBC 12.5 (*)    RBC 3.20 (*)    Hemoglobin 9.8 (*)    HCT 29.8 (*)    Neutro Abs 10.1 (*)    Monocytes Absolute 1.0 (*)    All other components within normal limits  URINALYSIS, ROUTINE W REFLEX MICROSCOPIC - Abnormal; Notable for the following:    Color, Urine YELLOW (*)    APPearance CLEAR (*)    Protein,  ur 30 (*)    Bacteria, UA RARE (*)    Squamous Epithelial / LPF 0-5 (*)    All other components within normal limits  BLOOD GAS, VENOUS - Abnormal; Notable for the following:    pCO2, Ven 37 (*)    All other components within normal limits  COMPREHENSIVE METABOLIC PANEL - Abnormal; Notable for the following:    Potassium 2.9 (*)    CO2 16 (*)    Glucose, Bld 1,104 (*)    Creatinine, Ser 1.67 (*)    Calcium 6.4 (*)    Total Protein 5.5 (*)    Albumin 2.0 (*)    AST 13 (*)    ALT 8 (*)    GFR calc non Af Amer 26 (*)    GFR calc Af Amer 30 (*)    All other components within normal limits  TROPONIN I - Abnormal; Notable for the following:    Troponin I 0.29 (*)    All other components within normal limits  PROTIME-INR - Abnormal; Notable for the following:    Prothrombin Time 16.7 (*)    All other components within normal limits  CULTURE, BLOOD (ROUTINE X 2)  CULTURE, BLOOD (ROUTINE X 2)  URINE CULTURE  LACTIC ACID, PLASMA  INFLUENZA PANEL BY PCR (TYPE A & B)  APTT  LACTIC ACID, PLASMA  BRAIN NATRIURETIC PEPTIDE  BASIC METABOLIC PANEL   ____________________________________________  EKG  ED ECG REPORT I, Rockne Menghini, the attending physician, personally viewed and interpreted this ECG.   Date: 05-12-2016  EKG Time: 1145  Rate: 86  Rhythm: normal sinus rhythm  Axis: leftward  Intervals:none  ST&T Change: No STEMI  ____________________________________________  RADIOLOGY  Dg Chest 2 View  Result Date: 05-12-2016 CLINICAL DATA:  Evaluation for sepsis. Patient states she has had a slight cough recently. Patient has a HX of CAD. EXAM: CHEST  2 VIEW COMPARISON:  01/19/2015 FINDINGS: Right-sided transvenous pacemaker leads to the right atrium and right ventricle. The heart is enlarged. There are patchy airspace filling opacities bilaterally. Bilateral pleural effusions are present. IMPRESSION: 1. Cardiomegaly. 2. Bilateral airspace filling opacities are consistent  with infectious process and/or edema . Electronically Signed   By: Norva Pavlov M.D.   On: 2016-05-12 14:17    ____________________________________________   PROCEDURES  Procedure(s) performed: None  Procedures  Critical Care performed: yes ____________________________________________   INITIAL IMPRESSION / ASSESSMENT AND PLAN / ED COURSE  Pertinent labs & imaging results that were available during my care of the patient were reviewed by me and considered in my medical decision making (see chart for details).  83 y.o. female with a cough presenting with hypoxia. Overall, the patient has evidence of sepsis with tachycardia, hypoxia, and a cough. We will initiate an. Antibiotics immediately, and plan to admit the patient to the hospital for further evaluation and treatment. The differential diagnosis is most strong for pneumonia, or influenza, and infectious pulmonary process. Other causes include congestive heart failure or acute cardiac cause including ACS or MI but these are less likely. Plan admission.  ----------------------------------------- 2:35 PM on 05/12/16 -----------------------------------------  The patient's chest x-ray does show bilateral opacities which are consistent with edema versus infection. Given the patient's symptoms, I'm more concerned about infection and she has been treated for pneumonia.  Her BNP is still outstanding, and CHF is still on the differential. Plan admission at this time.  CRITICAL CARE Performed by: Rockne Menghini   Total critical care time: 45 minutes  Critical care time was exclusive of separately billable procedures and treating other patients.  Critical care was necessary to treat or prevent imminent or life-threatening deterioration.  Critical care was time spent personally by me on the following activities: development of treatment plan with patient and/or surrogate as well as nursing, discussions with consultants,  evaluation of patient's response to treatment, examination of patient, obtaining history from patient or surrogate, ordering and performing treatments and interventions, ordering and review of laboratory studies, ordering and review of radiographic studies, pulse oximetry and re-evaluation of patient's condition.  ----------------------------------------- 3:33 PM on 2016/05/12 -----------------------------------------  The patient's nurse was called for several abnormal values and the patient's labs. She does have a blood sugar greater than 1000, and we'll treat her for this but also recheck it. In addition she does have a positive troponin. She is not acidotic on her VBG, and  we will evaluate her anion gap when the CMP returns.  Plan admission at this time.  ____________________________________________  FINAL CLINICAL IMPRESSION(S) / ED DIAGNOSES  Final diagnoses:  Sepsis, due to unspecified organism (HCC)  Hypoxia  HCAP (healthcare-associated pneumonia)  Elevated troponin  Hyperglycemia  Acute renal insufficiency         NEW MEDICATIONS STARTED DURING THIS VISIT:  New Prescriptions   No medications on file      Rockne Menghini, MD 05/17/2016 1436    Rockne Menghini, MD 04/29/2016 1534    Rockne Menghini, MD 05/09/2016 1540

## 2016-05-09 NOTE — ED Notes (Addendum)
Pt attempted to get out of bed, had taken off [place].  Pt repositioned in bed, O2 sat 84-88%, O2 incr to 87-88%.  Pt placed on NRB, O2 sat 90-91%, admitting MD informed.  Pt agitated, stating that she didn't feel "right", pt moving around in bed. CBG 191

## 2016-05-09 NOTE — ED Triage Notes (Signed)
Pt to ed via ems from [facility].  Pt with sats 88% on RA, placed on o2 at 2 lpm via [place],  sats up to 94%.  Pt alert and oriented, reports cough intermittently x several months, nonproductive.  Denies fever.  Skin warm and dry.  Pt placed on CM on arrival ekg done and shown to md.

## 2016-05-09 NOTE — ED Notes (Addendum)
Change in pts cardiac rhythm, admitting MD informed. Pt alert, agitated, trying to take off NRB, sts that she does not feel good.

## 2016-05-09 NOTE — ED Notes (Signed)
Family at bedside. Decr agitation in pt since ativan administrated, pt still moving in bed

## 2016-05-09 NOTE — Consult Note (Addendum)
Pharmacy Antibiotic Note  Joanna Young is a 83 y.o. female admitted on 2016/04/12 with pneumonia.  Pharmacy has been consulted for vancomycin and cefepime dosing. Vancomycin 1g and cefepime 2g given in ED  Plan: Will give next dose in 17 hours for stacked dosing Vancomycin 750mg   IV every 36hr hours.  Goal trough 15-20 mcg/mL.  Trough prior to the 5th dose Cefepime 1g q 24 hr crcl calculated using a height of 5'1" from a prior admission. CrCl= 20  Weight: 127 lb (57.6 kg)  Temp (24hrs), Avg:98.2 F (36.8 C), Min:98.2 F (36.8 C), Max:98.2 F (36.8 C)   Recent Labs Lab 25-Mar-2017 1313 25-Mar-2017 1424 25-Mar-2017 1540  WBC 12.5*  --   --   CREATININE  --  1.67* 1.45*  LATICACIDVEN 1.1  --   --     CrCl cannot be calculated (Unknown ideal weight.).    No Known Allergies  Antimicrobials this admission: cefepime 2/12 >>  vancomycin 2/12 >>   Dose adjustments this admission:   Microbiology results: 2/12 BCx:  2/12 UCx:    Thank you for allowing pharmacy to be a part of this patient's care.  Olene FlossMelissa D Maccia, Pharm.D, BCPS Clinical Pharmacist  2016/04/12 6:40 PM

## 2016-05-09 NOTE — ED Notes (Signed)
RT at bedside for ABG, lab called for results of BMP

## 2016-05-09 NOTE — H&P (Signed)
[facility] [place] [facility]   PATIENT NAME: Joanna Young    MR#:  [id number]  DATE OF BIRTH:  [birth year]  DATE OF ADMISSION:  05/02/2016  PRIMARY CARE PHYSICIAN: No PCP Per Patient   REQUESTING/REFERRING PHYSICIAN: Rockne Menghini, MD  CHIEF COMPLAINT:   cough HISTORY OF PRESENT ILLNESS:  Joanna Young  is a 83 y.o. female with a known history of Coronary artery disease, depression, dementia, diabetes mellitus and multiple other medical problems is sent over from [place] nursing home with DO NOT RESUSCITATE CODE STATUS for a chief complaint of cough and shortness of breath. Patient has been having cough for several weeks and her pulse ox was at 88% on room air. Patient was started on oxygen via nasal cannula and chest x-ray has revealed bilateral pneumonia. Patient is started on broad-spectrum IV antibiotics and hospitalist team is called to admit the patient. Meanwhile patient became very hypoxemic, tried a Ventimask as patient's hypoxia was not improving she was placed on nonrebreather and eventually on 100% FiO2 BiPAP. Also she became tachycardic and his rhythm was atrial fibrillation with RVR . Her clinical situation is drastically deteriorating at this time, could not reach patient's healthcare power of attorney Robin PAST MEDICAL HISTORY:   Past Medical History:  Diagnosis Date  . Allergic rhinitis   . Coronary artery disease 1/98  . Depression   . Diabetes mellitus    Type II  . GERD (gastroesophageal reflux disease)   . Hypertension   . Osteoporosis   . Pacemaker 01/26/2010   new dual-chamber   . Thyroid disease    Hyperthyroidism    PAST SURGICAL HISTOIRY:   Past Surgical History:  Procedure Laterality Date  . ABDOMINAL HYSTERECTOMY  1960's  . CATARACT EXTRACTION  02/14/06   OS  . CHOLECYSTECTOMY  1996  . INSERT / REPLACE / REMOVE PACEMAKER  01/26/2010   new dual chamber pacemaker   . IWMI  1/98  . PACEMAKER PLACEMENT  1998   Permanent pacemaker  . TONSILLECTOMY  1944  . US ECHOCARDIOGRAPHY  2/07   EF 45% -diast. dys. Fr.  . VAGINAL DELIVERY     x6    SOCIAL HISTORY:   Social History  Substance Use Topics  . Smoking status: Never Smoker  . Smokeless tobacco: Never Used  . Alcohol use No    FAMILY HISTORY:  History reviewed. No pertinent family history.  DRUG ALLERGIES:  No Known Allergies  REVIEW OF SYSTEMS:  Review of systems unobtainable as the patient is encephalopathic  MEDICATIONS AT HOME:   Prior to Admission medications   Medication Sig Start Date End Date Taking? Authorizing Provider  amLODipine (NORVASC) 10 MG tablet Take 10 mg by mouth at bedtime.    Yes Historical Provider, MD  aspirin 81 MG chewable tablet Chew 81 mg by mouth daily.   Yes Historical Provider, MD  atorvastatin (LIPITOR) 10 MG tablet Take 10 mg by mouth daily.   Yes Historical Provider, MD  Cholecalciferol (VITAMIN D3) 400 UNITS tablet Take 800 Units by mouth daily.   Yes Historical Provider, MD  ferrous sulfate 325 (65 FE) MG EC tablet Take 325 mg by mouth daily.   Yes Historical Provider, MD  fluticasone (FLONASE) 50 MCG/ACT nasal spray Place 1 spray into both nostrils daily.   Yes Historical Provider, MD  GLIPIZIDE PO Take 7.5 mg by mouth daily.   Yes Historical Provider, MD  levothyroxine (SYNTHROID, LEVOTHROID) 112 MCG tablet Take 112 mcg by mouth  daily before breakfast.   Yes Historical Provider, MD  sertraline (ZOLOFT) 50 MG tablet Take 50 mg by mouth at bedtime.   Yes Historical Provider, MD  acetaminophen (TYLENOL) 325 MG tablet Take 650 mg by mouth every 6 (six) hours as needed for mild pain or moderate pain.     Historical Provider, MD  loratadine (CLARITIN) 10 MG tablet Take 10 mg by mouth daily as needed for allergies.     Historical Provider, MD  promethazine (PHENERGAN) 12.5 MG tablet Take 12.5 mg by mouth every 4 (four) hours as needed. For nausea/vomiting.     Historical Provider, MD      VITAL  SIGNS:  Blood pressure 101/66, pulse (!) 152, temperature 98.2 F (36.8 C), temperature source Oral, resp. rate (!) 31, weight 57.6 kg (127 lb), SpO2 96 %.  PHYSICAL EXAMINATION:  GENERAL:  [age] y.o.-year-old patient lying in the bed with acute distress. Patient is very uncomfortable and trying to pull the BiPAP mask EYES: Pupils equal, round, reactive to light and accommodation. No scleral icterus.   HEENT: Head atraumatic, normocephalic. Oropharynx and nasopharynx clear.  NECK:  Supple, no jugular venous distention. No thyroid enlargement, no tenderness.  LUNGS: Diminished breath sounds bilaterally, diffuse crackles and rales bilaterally. Positive  use of accessory muscles of respiration.  CARDIOVASCULAR: Irregularly irregular. No murmurs, rubs, or gallops.  ABDOMEN: Soft, nontender, nondistended. Bowel sounds present. No organomegaly or mass.  EXTREMITIES: No pedal edema, cyanosis, or clubbing.  NEUROLOGIC: Patient is encephalopathic.  PSYCHIATRIC: The patient is encephalopathic SKIN: No obvious rash, lesion, or ulcer.   LABORATORY PANEL:   CBC  Recent Labs Lab 05/08/2016 1313  WBC 12.5*  HGB 9.8*  HCT 29.8*  PLT 343   ------------------------------------------------------------------------------------------------------------------  Chemistries   Recent Labs Lab 05/14/2016 1424 05/23/2016 1540  NA 108* 139  K 2.9* 3.7  CL 101 109  CO2 16* 22  GLUCOSE 1,104* 214*  BUN 18 23*  CREATININE 1.67* 1.45*  CALCIUM 6.4* 8.0*  AST 13*  --   ALT 8*  --   ALKPHOS 41  --   BILITOT 0.8  --    ------------------------------------------------------------------------------------------------------------------  Cardiac Enzymes  Recent Labs Lab 05/17/2016 1424  TROPONINI 0.29*   ------------------------------------------------------------------------------------------------------------------  RADIOLOGY:  Dg Chest 2 View  Result Date: 05/14/2016 CLINICAL DATA:  Evaluation for  sepsis. Patient states she has had a slight cough recently. Patient has a HX of CAD. EXAM: CHEST  2 VIEW COMPARISON:  01/19/2015 FINDINGS: Right-sided transvenous pacemaker leads to the right atrium and right ventricle. The heart is enlarged. There are patchy airspace filling opacities bilaterally. Bilateral pleural effusions are present. IMPRESSION: 1. Cardiomegaly. 2. Bilateral airspace filling opacities are consistent with infectious process and/or edema . Electronically Signed   By: Norva PavlovElizabeth  Brown M.D.   On: 05/18/2016 14:17    EKG:   Orders placed or performed during the hospital encounter of 05/23/2016  . ED EKG 12-Lead  . ED EKG 12-Lead    IMPRESSION AND PLAN:   Joanna Young  is a 83 y.o. female with a known history of Coronary artery disease, depression, dementia, diabetes mellitus and multiple other medical problems is sent over from [place] nursing home with DO NOT RESUSCITATE CODE STATUS for a chief complaint of cough and shortness of breath. Patient has been having cough for several weeks and her pulse ox was at 88% on room air. Patient was started on oxygen via nasal cannula and chest x-ray has revealed bilateral pneumonia. Patient is started  on broad-spectrum IV antibiotics and hospitalist team is called to admit the patient.Patient is critically ill and drastically deteriorating  # Acute hypoxic respiratory failure secondary to worsening of pneumonia Admit to intensive care unit, patient is critically ill with poor prognosis Currently patient is on BiPAP 100% FiO2-CODE STATUS DO NOT RESUSCITATE Broad-spectrum IV antibiotics with significant and vancomycin Nebulizer treatments Hydration with IV fluids Supportive treatment Stat pulmonary consult is placed and discussed with Dr. Darrol Angel. For now patient will remain on hospitalist service as per my discussion with Dr. Darrol Angel  #Sepsis secondary to pneumonia healthcare associated Patient meets septic criteria with  tachycardia and  leukocytosis Will provide broad-spectrum IV antibiotics cefepime and vancomycin and aggressive hydration with IV fluids and nebulizer treatments  #Atrial fibrillation with RVR-secondary to sepsis Patient is started on amiodarone drip after bolus IV fluids 1 dose of digoxin IV was given Not considering anticoagulation at this time Cardiology consult is placed and discussed with Dr. Evette Georges  # Acute kidney injury-prerenal IV fluids, avoid nephrotoxins  #Diabetes mellitus Sliding scale insulin for now and hyperglycemia protocol  Patient is critically ill, with a drastic clinical deterioration with poor prognosis. Call placed to power of attorney Robyn at [phone] and requested call back to update the situation. Awaiting call back, tried and her phone number [phone] with no success    All the records are reviewed and case discussed with ED provider. Management plans discussed with the patient, family and they are in agreement.  CODE STATUS:dnr  TOTAL Critical care TIME TAKING CARE OF THIS PATIENT: 60 minutes.   Note: This dictation was prepared with Dragon dictation along with smaller phrase technology. Any transcriptional errors that result from this process are unintentional.  Ramonita Lab M.D on 2016-05-12 at 5:26 PM  Between 7am to 6pm - Pager - [phone]  After 6pm go to [email or web address] - password [email or web address] [facility]  [facility]  Office  [phone]  CC: Primary care physician; No PCP Per Patient

## 2016-05-09 NOTE — Consult Note (Signed)
Name: Joanna Young MRN: [id number] DOB: 11-Nov-1924    ADMISSION DATE:  05/18/2016 CONSULTATION DATE:  05/20/2016  REFERRING MD : Dr, Amado Coe  CHIEF COMPLAINT:  Cough  BRIEF PATIENT DESCRIPTION: 83 yo female with acute hypoxic respiratory failure secondary to ARDS vs. Pulmonary Edema requiring continuous Bipap and Afibb with RVR   SIGNIFICANT EVENTS  02/12-Pt admitted with acute hypoxic respiratory failure secondary to ARDS vs. Pulmonary Edema requiring continuous Bipap and Afibb with RVR. PCCM consulted for additional management  STUDIES:  None  HISTORY OF PRESENT ILLNESS:  This is an 83 yo female with a PMH of Hyperthyroidism, Dual Chamber Pacemaker, Osteoporosis, HTN, GERD, Type II DM, Depression, CAD, and Allergic Rhinitis.  She presented to [facility] ER 02/12 from a nursing facility with lightheadedness during standing and hypoxia.  Per ER notes the pt has had several weeks of coughing and new onset shortness of breath HPI limited due to pts confusion.  In the ER it was noted she was hypoxic with O2 sats 87%-88% on room air and she was placed on nonrebreather mask, however due to further decline she was placed on continuous Bipap.  She also developed afibb with RVR and she ruled in for sepsis, therefore sepsis protocol initiated and pt was given 1L fluid bolus, CXR revealing bilateral airspace filling opacities concerning for ARDS vs Pulmonary Edema.  PCCM consulted 02/12 for additional management of acute hypoxic respiratory failure secondary to ARDS vs. Pulmonary Edema.  PAST MEDICAL HISTORY :   has a past medical history of Allergic rhinitis; Coronary artery disease (1/98); Depression; Diabetes mellitus; GERD (gastroesophageal reflux disease); Hypertension; Osteoporosis; Pacemaker (01/26/2010); and Thyroid disease.  has a past surgical history that includes Cholecystectomy (1996); Abdominal hysterectomy (1960's); pacemaker placement (1998); Tonsillectomy (1944); Vaginal delivery; IWMI (1/98);  US ECHOCARDIOGRAPHY (2/07); Cataract extraction (02/14/06); and Insert / replace / remove pacemaker (01/26/2010). Prior to Admission medications   Medication Sig Start Date End Date Taking? Authorizing Provider  amLODipine (NORVASC) 10 MG tablet Take 10 mg by mouth at bedtime.    Yes Historical Provider, MD  aspirin 81 MG chewable tablet Chew 81 mg by mouth daily.   Yes Historical Provider, MD  atorvastatin (LIPITOR) 10 MG tablet Take 10 mg by mouth daily.   Yes Historical Provider, MD  Cholecalciferol (VITAMIN D3) 400 UNITS tablet Take 800 Units by mouth daily.   Yes Historical Provider, MD  ferrous sulfate 325 (65 FE) MG EC tablet Take 325 mg by mouth daily.   Yes Historical Provider, MD  fluticasone (FLONASE) 50 MCG/ACT nasal spray Place 1 spray into both nostrils daily.   Yes Historical Provider, MD  GLIPIZIDE PO Take 7.5 mg by mouth daily.   Yes Historical Provider, MD  levothyroxine (SYNTHROID, LEVOTHROID) 112 MCG tablet Take 112 mcg by mouth daily before breakfast.   Yes Historical Provider, MD  sertraline (ZOLOFT) 50 MG tablet Take 50 mg by mouth at bedtime.   Yes Historical Provider, MD  acetaminophen (TYLENOL) 325 MG tablet Take 650 mg by mouth every 6 (six) hours as needed for mild pain or moderate pain.     Historical Provider, MD  loratadine (CLARITIN) 10 MG tablet Take 10 mg by mouth daily as needed for allergies.     Historical Provider, MD  promethazine (PHENERGAN) 12.5 MG tablet Take 12.5 mg by mouth every 4 (four) hours as needed. For nausea/vomiting.     Historical Provider, MD   No Known Allergies  FAMILY HISTORY:  family history is not on  file. SOCIAL HISTORY:  reports that she has never smoked. She has never used smokeless tobacco. She reports that she does not drink alcohol or use drugs.  REVIEW OF SYSTEMS:   Unable to assess pt confused and on Bipap   SUBJECTIVE:  Pt currently confused and on Bipap  VITAL SIGNS: Temp:  [98.2 F (36.8 C)] 98.2 F (36.8 C)  (02/12 1147) Pulse Rate:  [46-152] 87 (02/12 1845) Resp:  [16-43] 23 (02/12 1855) BP: (82-176)/(36-132) 120/84 (02/12 1855) SpO2:  [87 %-99 %] 92 % (02/12 1845) Weight:  [57.6 kg (127 lb)] 57.6 kg (127 lb) (02/12 1148)  PHYSICAL EXAMINATION: General: acutely ill Caucasian female on Bipap with sitter at bedside for safety Neuro: confused, follows commands, PERRLA HEENT: supple, no JVD Cardiovascular: NSR, s1s2, no M/R/G Lungs: diffuse crackles throughout, even, non labored on Bipap Abdomen: hypoactive BS x4, soft, non tender, non distended  Musculoskeletal: normal bulk and tone, no edema Skin: intact no rashes or lesions   Recent Labs Lab 05/02/2016 1424 May 10, 2016 1540  NA 108* 139  K 2.9* 3.7  CL 101 109  CO2 16* 22  BUN 18 23*  CREATININE 1.67* 1.45*  GLUCOSE 1,104* 214*    Recent Labs Lab May 10, 2016 1313  HGB 9.8*  HCT 29.8*  WBC 12.5*  PLT 343   Dg Chest 2 View  Result Date: 05/02/2016 CLINICAL DATA:  Evaluation for sepsis. Patient states she has had a slight cough recently. Patient has a HX of CAD. EXAM: CHEST  2 VIEW COMPARISON:  01/19/2015 FINDINGS: Right-sided transvenous pacemaker leads to the right atrium and right ventricle. The heart is enlarged. There are patchy airspace filling opacities bilaterally. Bilateral pleural effusions are present. IMPRESSION: 1. Cardiomegaly. 2. Bilateral airspace filling opacities are consistent with infectious process and/or edema . Electronically Signed   By: Norva Pavlov M.D.   On: 05/02/2016 14:17    ASSESSMENT / PLAN: Acute hypoxic respiratory failure secondary to ARDS vs. Pulmonary Edema Atrial Fibrillation with RVR Acute renal failure  Hypotension secondary to sepsis  Mildly elevated troponin's likely secondary to demand ischemia due to respiratory failure P: Continuous Bipap wean as tolerated  Maintain O2 sats >92% Continue Cefepime and Vancomycin Will hold off on diuresis for now due to periods of  hypotension Amiodarone gtt  Cardiology consulted appreciate input Maintain map >65, prn peripheral neo-synephrine to maintain map goal  Trend troponin's Trend PCT's Monitor CBC and fever curve  Continue IV steroids Continue bronchodilator therapy  Prn Morphine for pain or air hunger Palliative Care consulted appreciate input CXR in am  Frequent reorientation Lights on during the day   -Update: Discussed pts status and plan of care with pts son Lollie Sails and pts daughter Melina Schools who is the pts Healthcare POA they confirmed pts code status is DO NOT RESUSCITATE.  They are in agreement with the current plan of care, however if pt further declines they would like the pt to be made comfort care only.  Their main priority at this point is to ensure the pt does not suffer.    Sonda Rumble, AGNP  Pulmonary/Critical Care Pager [phone] (please enter 7 digits) PCCM Consult Pager [phone] (please enter 7 digits)   PCCM ATTENDING ATTESTATION:  I have evaluated patient with the APP [place] on the day of admission, reviewed database in its entirety and discussed care plan in detail. In addition, this patient was discussed on multidisciplinary rounds.    Major problems addressed by PCCM team: Severe hypoxic respiratory failure Likely  pulmonary edema Possible PNA Paroxysmal AF with RVR  PLAN/REC: I have now had an opportunity to speak in person with pt's daughter and son. They understand the severity of her critical illness and desire that we focus entirely on her comfort understanding that the likelihood of survival is essentially nil. Comfort care orders palced   Billy Fischeravid Lagena Strand, MD PCCM service Mobile [phone] Pager [phone] 05/06/2016

## 2016-05-09 NOTE — ED Notes (Signed)
Pts O2 sat decr 804-88% w/ NRB.  Admitting MD informed and verbal order for BiPAP

## 2016-05-09 NOTE — ED Notes (Signed)
Pt asleep, daughter at bedside.  Joanna Young offered to sit for daughter, daughter refused

## 2016-05-09 NOTE — ED Notes (Addendum)
Pt repositioned in bed. Pt informed that she has been seen by admitting MD, fall alarm placed on patient

## 2016-05-09 NOTE — Significant Event (Signed)
Full consult note to follow. I spoke with pt's son, Joanna Young, and explained that his mom is very critically ill with respiratory failure. I confirmed her DNR status. I explained that it is unlikely that we will be able to support her through this illness and that we must ensure that she not suffer. He concurred with this. I spoke with Dr Amado CoeGouru and recommended PRN morphine  Joanna Fischeravid Thurmond Hildebran, MD PCCM service Mobile [phone] Pager [phone] Jun 17, 2016

## 2016-05-10 ENCOUNTER — Inpatient Hospital Stay: Payer: Medicare Other

## 2016-05-10 DIAGNOSIS — J810 Acute pulmonary edema: Secondary | ICD-10-CM

## 2016-05-10 DIAGNOSIS — Z7189 Other specified counseling: Secondary | ICD-10-CM

## 2016-05-10 LAB — CBC WITH DIFFERENTIAL/PLATELET
Basophils Absolute: 0.1 10*3/uL (ref 0–0.1)
Basophils Relative: 0 %
EOS ABS: 0 10*3/uL (ref 0–0.7)
Eosinophils Relative: 0 %
HEMATOCRIT: 32 % — AB (ref 35.0–47.0)
HEMOGLOBIN: 10.5 g/dL — AB (ref 12.0–16.0)
LYMPHS ABS: 0.4 10*3/uL — AB (ref 1.0–3.6)
Lymphocytes Relative: 1 %
MCH: 30.4 pg (ref 26.0–34.0)
MCHC: 32.8 g/dL (ref 32.0–36.0)
MCV: 92.9 fL (ref 80.0–100.0)
MONO ABS: 1 10*3/uL — AB (ref 0.2–0.9)
Monocytes Relative: 3 %
NEUTROS ABS: 30.8 10*3/uL — AB (ref 1.4–6.5)
Neutrophils Relative %: 96 %
Platelets: 363 10*3/uL (ref 150–440)
RBC: 3.44 MIL/uL — ABNORMAL LOW (ref 3.80–5.20)
RDW: 14.2 % (ref 11.5–14.5)
WBC: 32.2 10*3/uL — ABNORMAL HIGH (ref 3.6–11.0)

## 2016-05-10 LAB — COMPREHENSIVE METABOLIC PANEL
ALK PHOS: 95 U/L (ref 38–126)
ALT: 38 U/L (ref 14–54)
AST: 50 U/L — ABNORMAL HIGH (ref 15–41)
Albumin: 2.6 g/dL — ABNORMAL LOW (ref 3.5–5.0)
Anion gap: 13 (ref 5–15)
BILIRUBIN TOTAL: 0.6 mg/dL (ref 0.3–1.2)
BUN: 27 mg/dL — ABNORMAL HIGH (ref 6–20)
CALCIUM: 7.9 mg/dL — AB (ref 8.9–10.3)
CO2: 17 mmol/L — ABNORMAL LOW (ref 22–32)
CREATININE: 1.68 mg/dL — AB (ref 0.44–1.00)
Chloride: 108 mmol/L (ref 101–111)
GFR calc Af Amer: 30 mL/min — ABNORMAL LOW (ref >60)
GFR, EST NON AFRICAN AMERICAN: 26 mL/min — AB (ref >60)
Glucose, Bld: 306 mg/dL — ABNORMAL HIGH (ref 65–99)
Potassium: 4.7 mmol/L (ref 3.5–5.1)
Sodium: 138 mmol/L (ref 135–145)
TOTAL PROTEIN: 6.7 g/dL (ref 6.5–8.1)

## 2016-05-10 LAB — URINE CULTURE: Culture: NO GROWTH

## 2016-05-10 LAB — TROPONIN I: Troponin I: 0.4 ng/mL (ref <0.03)

## 2016-05-10 LAB — BRAIN NATRIURETIC PEPTIDE: B Natriuretic Peptide: 838 pg/mL — ABNORMAL HIGH (ref 0.0–100.0)

## 2016-05-10 MED ORDER — MORPHINE SULFATE (PF) 4 MG/ML IV SOLN: 2.0000 mg | INTRAVENOUS | Status: DC | PRN

## 2016-05-10 MED ORDER — MORPHINE 100MG IN NS 100ML (1MG/ML) PREMIX INFUSION
2.0000 mg/h | INTRAVENOUS | Status: DC
  Administered 2016-05-10: 2 mg/h via INTRAVENOUS
  Filled 2016-05-10: qty 100

## 2016-05-10 MED ORDER — MORPHINE SULFATE 25 MG/ML IV SOLN: 2.0000 mg/h | INTRAVENOUS | Status: DC

## 2016-05-10 MED ORDER — MORPHINE SULFATE (PF) 4 MG/ML IV SOLN
2.0000 mg | INTRAVENOUS | Status: DC | PRN
  Administered 2016-05-10 (×2): 2 mg via INTRAVENOUS
  Filled 2016-05-10 (×2): qty 1

## 2016-05-13 ENCOUNTER — Telehealth: Payer: Self-pay

## 2016-05-13 NOTE — Telephone Encounter (Signed)
Placed death certificate in nurse box. [phone] when death Certificate is ready for pick up.

## 2016-05-14 LAB — CULTURE, BLOOD (ROUTINE X 2)
CULTURE: NO GROWTH
Culture: NO GROWTH

## 2016-05-26 NOTE — Progress Notes (Signed)
1338 Pronounced dead by M.FlowersRN and Mariann BarterS. Borba RN. Family in attendance.

## 2016-05-26 NOTE — Discharge Summary (Signed)
   [facility] -  at [facility]   PATIENT NAME: Joanna Young    MR#:  [id number]  DATE OF BIRTH:  [birth year]  DATE OF ADMISSION:  05/21/2016   ADMITTING PHYSICIAN: Ramonita LabAruna Gouru, MD  DATE OF DISCHARGE: 2016/10/03  1:38 PM  PRIMARY CARE PHYSICIAN: No PCP Per Patient   ADMISSION DIAGNOSIS:  Acute respiratory failure (HCC) [J96.00] Acute renal insufficiency [N28.9] Hyperglycemia [R73.9] Hypoxia [R09.02] Elevated troponin [R74.8] HCAP (healthcare-associated pneumonia) [J18.9] Sepsis, due to unspecified organism (HCC) [A41.9] DISCHARGE DIAGNOSIS:  Active Problems:   Acute respiratory failure (HCC)   Acute pulmonary edema (HCC)   Goals of care, counseling/discussion Acute hypoxic respiratory failure with hypoxia secondary to sepsis with pneumonia Atrial fibrillation with RVR-secondary to sepsis Acute kidney injury-prerenal SECONDARY DIAGNOSIS:   Past Medical History:  Diagnosis Date  . Allergic rhinitis   . Coronary artery disease 1/98  . Depression   . Diabetes mellitus    Type II  . GERD (gastroesophageal reflux disease)   . Hypertension   . Osteoporosis   . Pacemaker 01/26/2010   new dual-chamber   . Thyroid disease    Hyperthyroidism   HOSPITAL COURSE:   Joanna Young a 83 y.o. femalewith a known history of Coronary artery disease, depression, dementia, diabetes mellitus and multiple other medical problems is sent over from [facility] with DO NOT RESUSCITATE CODE STATUS for a chief complaint of cough and shortness of breath. Patient has been having cough for several weeks and her pulse ox was at 88% on room air. Patient was started on oxygen via nasal cannula and chest x-ray has revealed bilateral pneumonia. Patient is started on broad-spectrum IV antibiotics and hospitalist team is called to admit the patient.Patient is critically ill and drastically deteriorating  # Acute hypoxic respiratory failure with hypoxia secondary to sepsis  with pneumonia continue BiPAP 100% FiO2 Continue cefepime and vancomycin, f/u B/C and CBC. Nebulizer treatments Hydration with IV fluids Supportive treatment The patient is critically ill with poor prognosis, need comfort care.  #Sepsis secondary to pneumonia healthcare associated continue IV cefepime and vancomycin with IV fluids and nebulizer treatments  #Atrial fibrillation with RVR-secondary to sepsis Patient is started on amiodarone drip after bolus IV fluids 1 dose of digoxin IV was given Not considering anticoagulation at this time  # Acute kidney injury-prerenal IV fluids, avoid nephrotoxins  #Diabetes mellitus Continue Sliding scale insulin The patient is critically ill with poor prognosis, she was put comfort care. 1338 Pronounced dead by M.FlowersRN and Mariann BarterS. Borba RN. Family in attendance. DISCHARGE CONDITIONS:  The patient expired at 13:38,2016/10/03.  Shaune Pollackhen, Nyeemah Jennette M.D on 2016/10/03 at 4:38 PM  Between 7am to 6pm - Pager - [phone]  After 6pm go to [email or web address] - Scientist, research (life sciences)password [email or web address]  [facility]  Office  [phone]  CC: Primary care physician; No PCP Per Patient   Note: This dictation was prepared with Dragon dictation along with smaller phrase technology. Any transcriptional errors that result from this process are unintentional.

## 2016-05-26 NOTE — Progress Notes (Signed)
Daughter called and informed of 3 rings,1 bracelet&1neckace(all gold colored) left at hospital. Daughter Karlyn Agee(Robin Lawson) requested all [facility].

## 2016-05-26 NOTE — Consult Note (Signed)
[facility]  CARDIOLOGY CONSULT NOTE  Patient ID: Joanna IdeRuth A Young MRN: [id number] DOB/AGE: [birth year] 81 y.o.  Admit date: March 15, 2017 Referring Physician Dr. Amado CoeGouru Primary Physician no PCP on file Primary Cardiologist Dr. Lewayne BuntingGregg Taylor Reason for Consultation Atrial fibrillation with RVR  HPI: 83 year old female referred for atrial fibrillation with rapid ventricular rate. Patient is a nursing home resident with a history of symptomatic bradycardia, status post dual-chamber pacemaker in 1998, CAD with unknown specifics, hypertension, type 2 diabetes, and dementia. Patient was noted at the nursing home yesterday to be hypoxic and short of breath, and [facility] . ECG revealed normal sinus rhythm. There were numerous abnormal admission labs, notably troponin 0.40, WBC 32.2, K 2.9, Cr 1.45. Patient was admitted for acute hypoxic respiratory failure secondary to ARDs verus pulmonary edema requiring BiPAP. She later converted to atrial fibrillation with RVR with no known history of such, and was started on amiodarone drip. She is now in sinus tachycardia. With underlying dementia and altered mental state, patient unable to provide history or review of systems.    Past Medical History:  Diagnosis Date  . Allergic rhinitis   . Coronary artery disease 1/98  . Depression   . Diabetes mellitus    Type II  . GERD (gastroesophageal reflux disease)   . Hypertension   . Osteoporosis   . Pacemaker 01/26/2010   new dual-chamber   . Thyroid disease    Hyperthyroidism    Past Surgical History:  Procedure Laterality Date  . ABDOMINAL HYSTERECTOMY  1960's  . CATARACT EXTRACTION  02/14/06   OS  . CHOLECYSTECTOMY  1996  . INSERT / REPLACE / REMOVE PACEMAKER  01/26/2010   new dual chamber pacemaker   . IWMI  1/98  . PACEMAKER PLACEMENT  1998   Permanent pacemaker  . TONSILLECTOMY  1944  . US ECHOCARDIOGRAPHY  2/07   EF 45% -diast. dys. Fr.  . VAGINAL DELIVERY     x6    Prescriptions  Prior to Admission  Medication Sig Dispense Refill Last Dose  . amLODipine (NORVASC) 10 MG tablet Take 10 mg by mouth at bedtime.    05/08/2016 at 2000  . aspirin 81 MG chewable tablet Chew 81 mg by mouth daily.   March 15, 2017 at 0800  . atorvastatin (LIPITOR) 10 MG tablet Take 10 mg by mouth daily.   05/08/2016 at 2000  . Cholecalciferol (VITAMIN D3) 400 UNITS tablet Take 800 Units by mouth daily.   05/08/2016 at 1200  . ferrous sulfate 325 (65 FE) MG EC tablet Take 325 mg by mouth daily.   March 15, 2017 at 0800  . fluticasone (FLONASE) 50 MCG/ACT nasal spray Place 1 spray into both nostrils daily.   March 15, 2017 at 0800  . GLIPIZIDE PO Take 7.5 mg by mouth daily.   March 15, 2017 at 0800  . levothyroxine (SYNTHROID, LEVOTHROID) 112 MCG tablet Take 112 mcg by mouth daily before breakfast.   March 15, 2017 at 0800  . sertraline (ZOLOFT) 50 MG tablet Take 50 mg by mouth at bedtime.   05/08/2016 at 2000  . acetaminophen (TYLENOL) 325 MG tablet Take 650 mg by mouth every 6 (six) hours as needed for mild pain or moderate pain.    prn at prn  . loratadine (CLARITIN) 10 MG tablet Take 10 mg by mouth daily as needed for allergies.    prn at prn  . promethazine (PHENERGAN) 12.5 MG tablet Take 12.5 mg by mouth every 4 (four) hours as needed. For nausea/vomiting.  prn at prn   Social History   Social History  . Marital status: Widowed    Spouse name: N/A  . Number of children: 6  . Years of education: N/A   Occupational History  . Retired    Social History Main Topics  . Smoking status: Never Smoker  . Smokeless tobacco: Never Used  . Alcohol use No  . Drug use: No  . Sexual activity: Not on file   Other Topics Concern  . Not on file   Social History Narrative   Pt is disabled.   Gets regular exercise.   Husband in prison.    History reviewed. No pertinent family history.     PHYSICAL EXAM  General: Ill-appearing, agitated HEENT:  Normocephalic and atramatic Neck:  No JVD.  Lungs: Increased effort  of breathing; decreased breath sounds and diffuse crackles bilaterally.  Heart: Tachycardic, regular rhythm. No gallops or murmurs.  Abdomen: Bowel sounds are positive, abdomen soft and non-tender  Msk:  Patient lying supine in bed Extremities: No clubbing, cyanosis or edema.   Neuro: altered and disoriented Psych:  Encephalopathic  Labs:   Lab Results  Component Value Date   WBC 32.2 (H) 05/18/2016   HGB 10.5 (L) 04/28/2016   HCT 32.0 (L) 04/29/2016   MCV 92.9 05/20/2016   PLT 363 05/13/2016    Recent Labs Lab 04/28/2016 0553  NA 138  K 4.7  CL 108  CO2 17*  BUN 27*  CREATININE 1.68*  CALCIUM 7.9*  PROT 6.7  BILITOT 0.6  ALKPHOS 95  ALT 38  AST 50*  GLUCOSE 306*   Lab Results  Component Value Date   TROPONINI 0.40 (HH) 05/13/2016   No results found for: CHOL No results found for: HDL No results found for: LDLCALC No results found for: TRIG No results found for: CHOLHDL No results found for: LDLDIRECT    Radiology: Dg Chest 2 View  Result Date: 05-12-16 CLINICAL DATA:  Evaluation for sepsis. Patient states she has had a slight cough recently. Patient has a HX of CAD. EXAM: CHEST  2 VIEW COMPARISON:  01/19/2015 FINDINGS: Right-sided transvenous pacemaker leads to the right atrium and right ventricle. The heart is enlarged. There are patchy airspace filling opacities bilaterally. Bilateral pleural effusions are present. IMPRESSION: 1. Cardiomegaly. 2. Bilateral airspace filling opacities are consistent with infectious process and/or edema . Electronically Signed   By: Norva Pavlov M.D.   On: 05/12/2016 14:17   Dg Chest Port 1 View  Result Date: 04/29/2016 CLINICAL DATA:  Respiratory failure. EXAM: PORTABLE CHEST 1 VIEW COMPARISON:  May 12, 2016. FINDINGS: Cardiac pacer noted in stable position. Stable cardiomegaly. Progressive bilateral pulmonary infiltrates. Bilateral pleural effusions also again noted. Apical pleural thickening again noted most likely secondary  scarring . No pneumothorax. IMPRESSION: 1. Progressive bilateral pulmonary infiltrates/ edema. Progression of bilateral pleural effusions also noted. 2. Cardiac pacer stable position.  Stable cardiomegaly . Electronically Signed   By: Maisie Fus  Register   On: 05/04/2016 06:42    EKG: Sinus tachycardia, rate 110 bpm  ASSESSMENT AND PLAN:  1. Atrial fibrillation with RVR in the setting of sepsis and acute hypoxic respiratory failure secondary to ARDs verus pulmonary edema requiring BiPAP, on amiodarone drip, now appropriately converted to sinus tachycardia. 2. Borderline elevated troponin, likely demand-supply ischemia, trending up.  Recommendations:  1. Agree with current therapy 2. Continue amiodarone drip for now until patient able to take PO. 4. Review echocardiogram   Signed: Leanora Ivanoff, PA-C 05/02/2016, 8:30 AM

## 2016-05-26 NOTE — Progress Notes (Signed)
Nutrition Brief Note  Chart reviewed. Pt now transitioning to comfort care.  No further nutrition interventions warranted at this time.  Please re-consult as needed.   Teleshia Lemere M. Yasenia Reedy, MS, RD LDN Inpatient Clinical Dietitian Pager [phone]    

## 2016-05-26 NOTE — Progress Notes (Signed)
[employer] - [place] at [facility]   PATIENT NAME: Joanna Young    MR#:  [id number]  DATE OF BIRTH:  1925-02-16  SUBJECTIVE:  CHIEF COMPLAINT:   Chief Complaint  Patient presents with  . Cough   ON BIPAP WITH FIO2 100%. SAT 87%. The patient is unresponsive. REVIEW OF SYSTEMS:  Review of Systems  Unable to perform ROS: Critical illness    DRUG ALLERGIES:  No Known Allergies VITALS:  Blood pressure 135/69, pulse 90, temperature 97.8 F (36.6 C), temperature source Oral, resp. rate (!) 23, height 5\' 2"  (1.575 m), weight 129 lb 3 oz (58.6 kg), SpO2 (!) 89 %. PHYSICAL EXAMINATION:  Physical Exam  Constitutional:  Critical ill-looking. On BIPAP.  HENT:  Head: Normocephalic.  Eyes: Pupils are equal, round, and reactive to light. No scleral icterus.  Neck: Neck supple. No JVD present. No tracheal deviation present.  Cardiovascular: Normal rate and regular rhythm.   Pulmonary/Chest: She is in respiratory distress. She has rales.  Abdominal: Soft. Bowel sounds are normal. She exhibits no distension. There is no tenderness.  Musculoskeletal: She exhibits no edema.  Neurological:  Unresponsive.  Skin: No rash noted. No erythema.   LABORATORY PANEL:   CBC  Recent Labs Lab 05/19/2016 0553  WBC 32.2*  HGB 10.5*  HCT 32.0*  PLT 363   ------------------------------------------------------------------------------------------------------------------ Chemistries   Recent Labs Lab 05/20/2016 0553  NA 138  K 4.7  CL 108  CO2 17*  GLUCOSE 306*  BUN 27*  CREATININE 1.68*  CALCIUM 7.9*  AST 50*  ALT 38  ALKPHOS 95  BILITOT 0.6   RADIOLOGY:  Dg Chest 2 View  Result Date: June 18, 2016 CLINICAL DATA:  Evaluation for sepsis. Patient states she has had a slight cough recently. Patient has a HX of CAD. EXAM: CHEST  2 VIEW COMPARISON:  01/19/2015 FINDINGS: Right-sided transvenous pacemaker leads to the right atrium and right ventricle. The heart is enlarged. There  are patchy airspace filling opacities bilaterally. Bilateral pleural effusions are present. IMPRESSION: 1. Cardiomegaly. 2. Bilateral airspace filling opacities are consistent with infectious process and/or edema . Electronically Signed   By: Norva PavlovElizabeth  Brown M.D.   On: 0March 24, 2018 14:17   Dg Chest Port 1 View  Result Date: 05/14/2016 CLINICAL DATA:  Respiratory failure. EXAM: PORTABLE CHEST 1 VIEW COMPARISON:  0March 24, 2018. FINDINGS: Cardiac pacer noted in stable position. Stable cardiomegaly. Progressive bilateral pulmonary infiltrates. Bilateral pleural effusions also again noted. Apical pleural thickening again noted most likely secondary scarring . No pneumothorax. IMPRESSION: 1. Progressive bilateral pulmonary infiltrates/ edema. Progression of bilateral pleural effusions also noted. 2. Cardiac pacer stable position.  Stable cardiomegaly . Electronically Signed   By: Maisie Fushomas  Register   On: 05/22/2016 06:42   ASSESSMENT AND PLAN:   Joanna Young  is a 83 y.o. female with a known history of Coronary artery disease, depression, dementia, diabetes mellitus and multiple other medical problems is sent over from [facility] nursing home with DO NOT RESUSCITATE CODE STATUS for a chief complaint of cough and shortness of breath. Patient has been having cough for several weeks and her pulse ox was at 88% on room air. Patient was started on oxygen via nasal cannula and chest x-ray has revealed bilateral pneumonia. Patient is started on broad-spectrum IV antibiotics and hospitalist team is called to admit the patient.Patient is critically ill and drastically deteriorating  # Acute hypoxic respiratory failure with hypoxia secondary to sepsis with pneumonia continue BiPAP 100% FiO2 Continue cefepime and vancomycin, f/u  B/C and CBC. Nebulizer treatments Hydration with IV fluids Supportive treatment The patient is critically ill with poor prognosis, need comfort care.  #Sepsis secondary to pneumonia healthcare  associated continue IV cefepime and vancomycin with IV fluids and nebulizer treatments  #Atrial fibrillation with RVR-secondary to sepsis Patient is started on amiodarone drip after bolus IV fluids 1 dose of digoxin IV was given Not considering anticoagulation at this time  # Acute kidney injury-prerenal IV fluids, avoid nephrotoxins  #Diabetes mellitus Continue Sliding scale insulin  I discussed with Dr. Sung Amabile. Will have family meeting to discuss comfort care. All the records are reviewed and case discussed with Care Management/Social Worker. Management plans discussed with the patient, family and they are in agreement.  CODE STATUS: DNR  TOTAL CRITICAL TIME TAKING CARE OF THIS PATIENT: 42 minutes.   More than 50% of the time was spent in counseling/coordination of care: YES  POSSIBLE D/C IN ? DAYS, DEPENDING ON CLINICAL CONDITION.   Shaune Pollack M.D on 05-28-16 at 8:41 AM  Between 7am to 6pm - Pager - [phone]  After 6pm go to [email or web address] - Scientist, research (life sciences) [facility]  Office  [phone]  CC: Primary care physician; No PCP Per Patient  Note: This dictation was prepared with Dragon dictation along with smaller phrase technology. Any transcriptional errors that result from this process are unintentional.

## 2016-05-26 NOTE — Progress Notes (Signed)
1200 Morphine increased to 5 mg/hr.1230 BiPAP removed per family request. 1300 Morphine drip increased to 8 mg/hour.

## 2016-05-26 DEATH — deceased

## 2017-02-04 IMAGING — CT CT HEAD W/O CM
1 series · 16 of 30 positions shown, 20 images · non-contrast
Comparison: None

CLINICAL DATA: Weakness, dizziness, and difficulty seeing of the
right eye.

EXAM:
CT HEAD WITHOUT CONTRAST
TECHNIQUE: Contiguous axial images were obtained from the base of the skull
through the vertex without intravenous contrast.

[Series 2: head wo · axial · 0.39mm/px · z∈[-642,-512]mm · 16 of 32 slices shown, 20 images]
[im 2/32  brain]
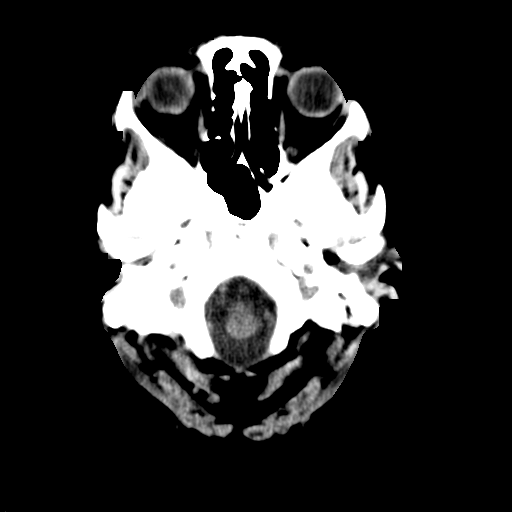
[im 2/32  bone]
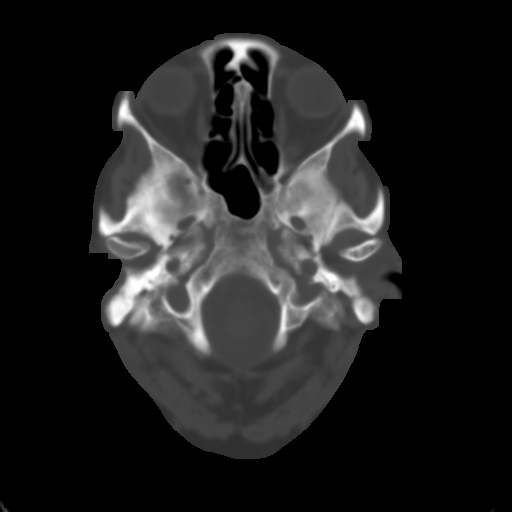
[im 4/32  brain]
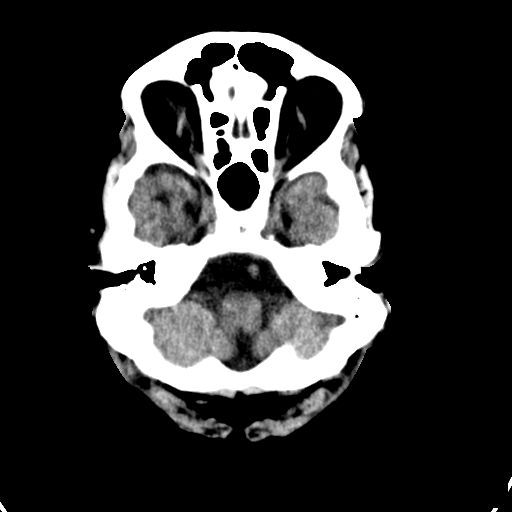
[im 6/32  brain]
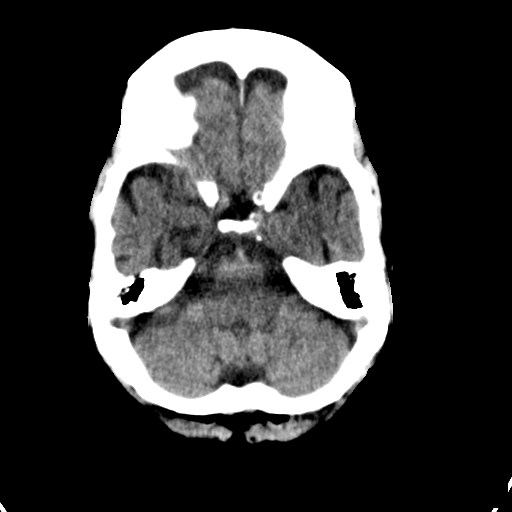
[im 8/32  brain]
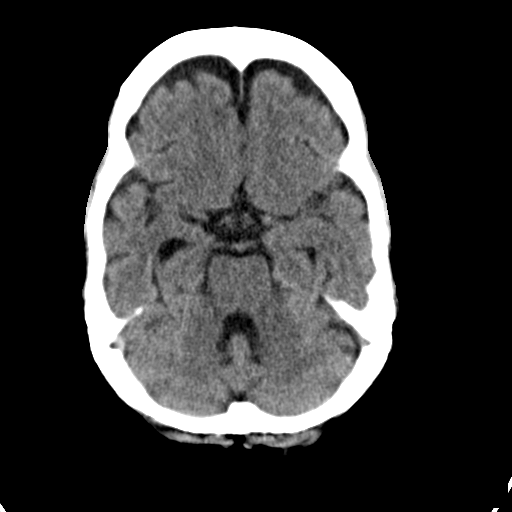
[im 9/32  brain]
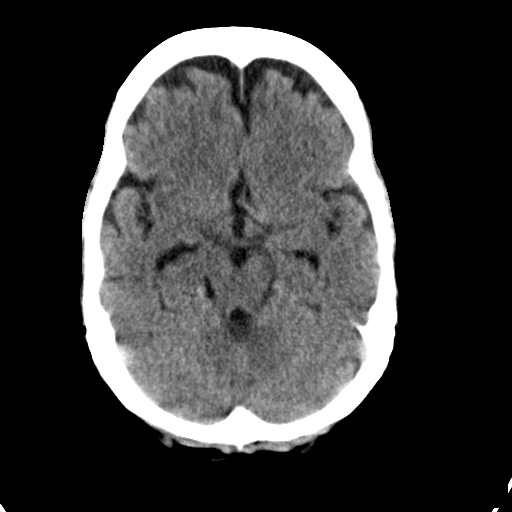
[im 9/32  bone]
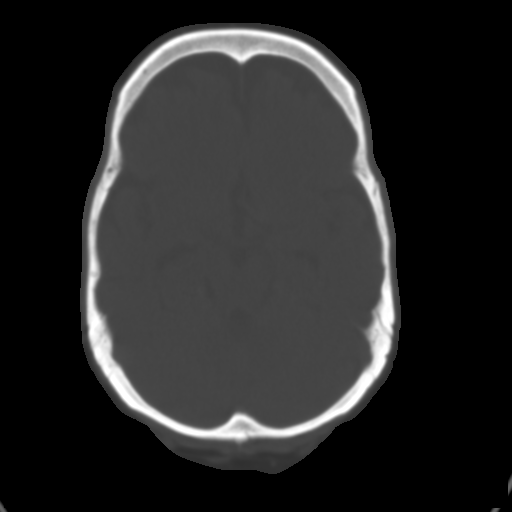
[im 11/32  brain]
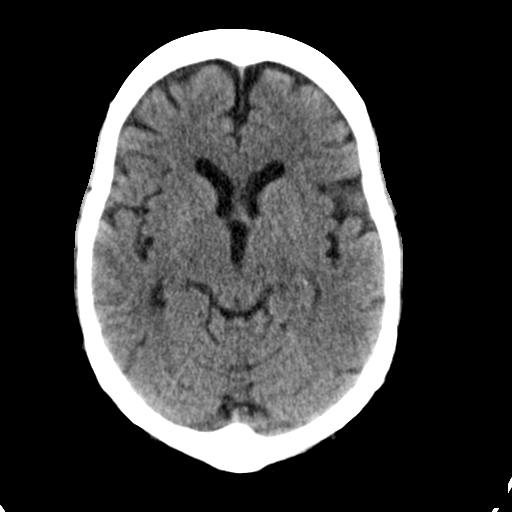
[im 13/32  brain]
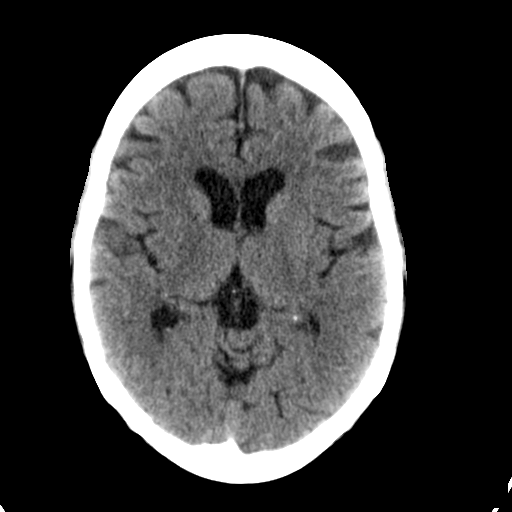
[im 15/32  brain]
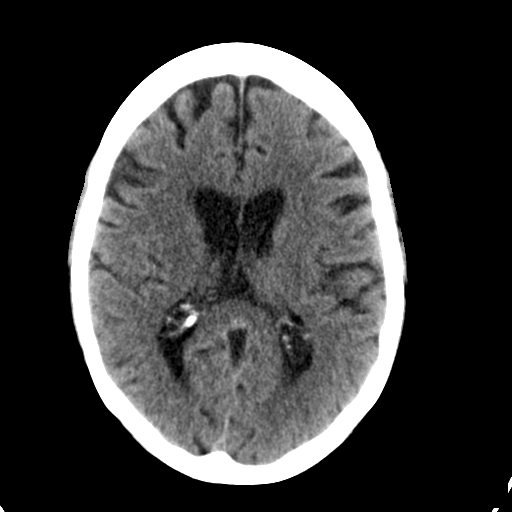
[im 17/32  brain]
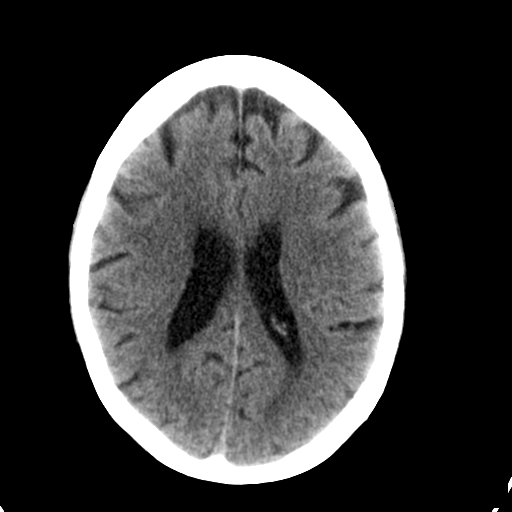
[im 17/32  bone]
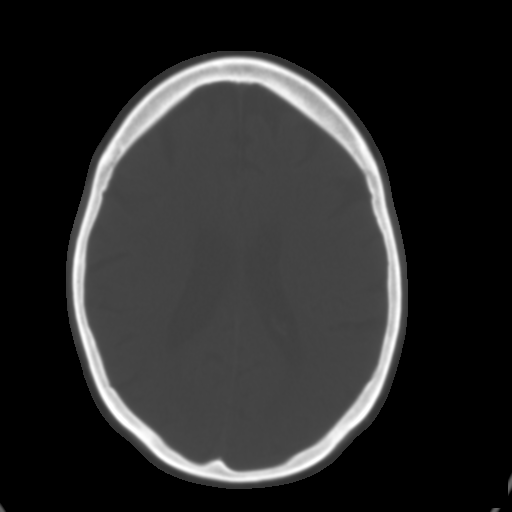
[im 19/32  brain]
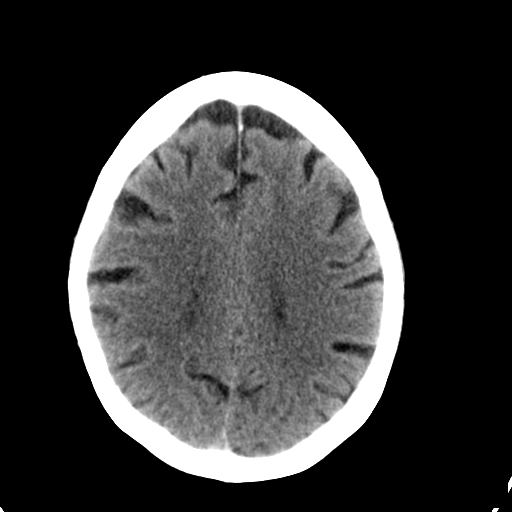
[im 21/32  brain]
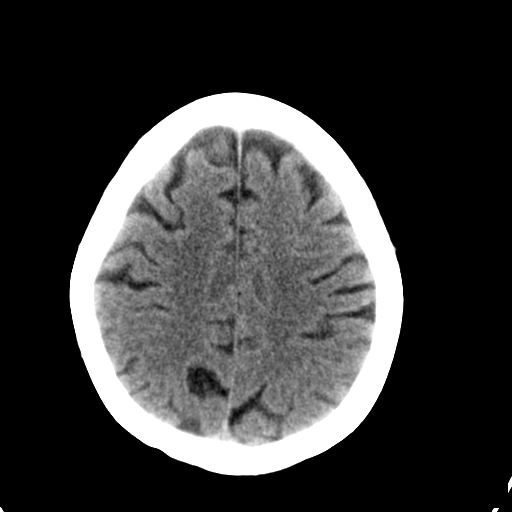
[im 23/32  brain]
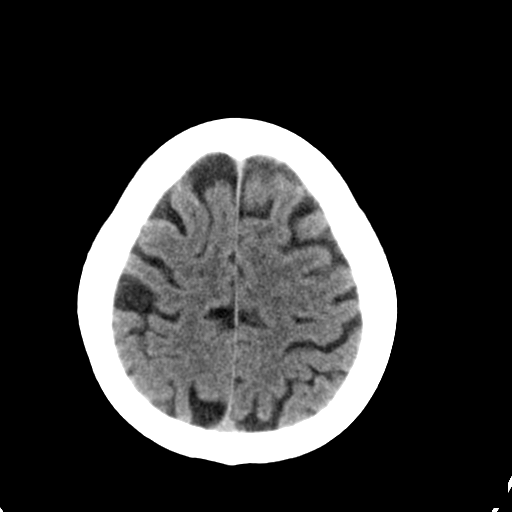
[im 24/32  brain]
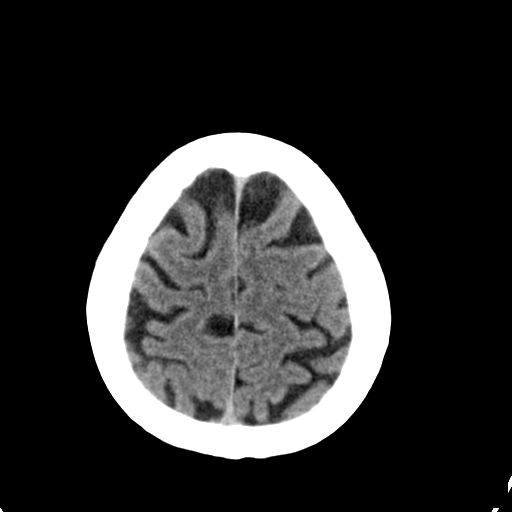
[im 24/32  bone]
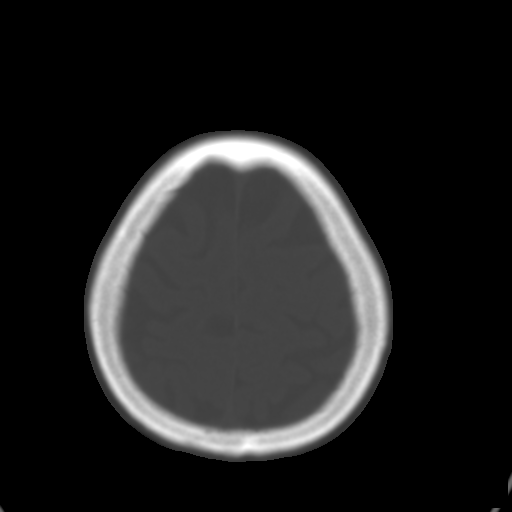
[im 26/32  brain]
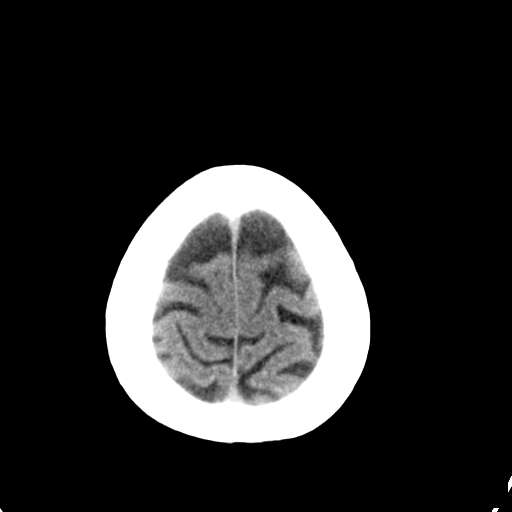
[im 28/32  brain]
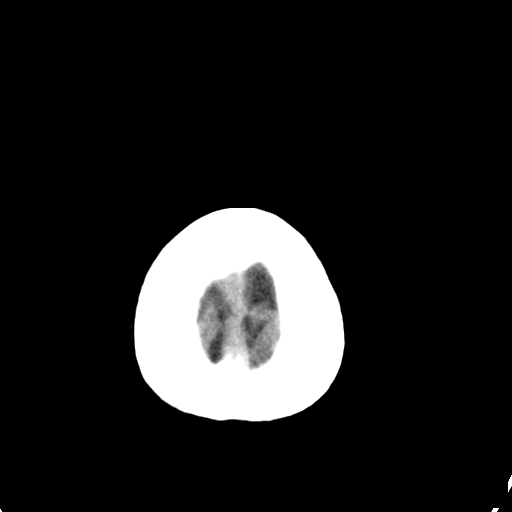
[im 30/32  brain]
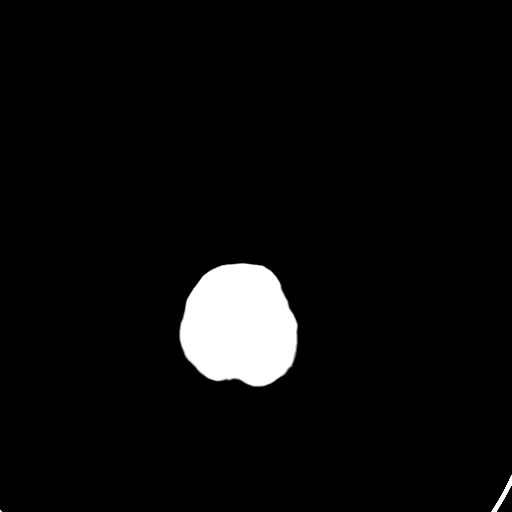

[16 of 30 positions shown; findings below may reference images not displayed]

FINDINGS: There is mild, age-appropriate cerebral atrophy. There is no
evidence of acute cortical infarct, intracranial hemorrhage, mass,
midline shift, or extra-axial fluid collection.

Visualized orbits are unremarkable. Small amount of secretions are
noted in the left sphenoid sinus. There are small bilateral mastoid
effusions. Calcified atherosclerosis is noted at the skullbase.
IMPRESSION: No evidence of acute intracranial abnormality. Unremarkable CT
appearance of the brain for age.

## 2017-02-05 IMAGING — CR DG CHEST 2V
1 series · 2 of 2 positions shown · non-contrast
Comparison: 01/17/2015

CLINICAL DATA: Pneumonia, CHF, coronary artery disease, diabetes
mellitus, dementia, hypertension

EXAM:
CHEST  2 VIEW

[Series 1: dg chest 2 view · 0.14mm/px · 2 of 2 slices shown]
[im 1/2]
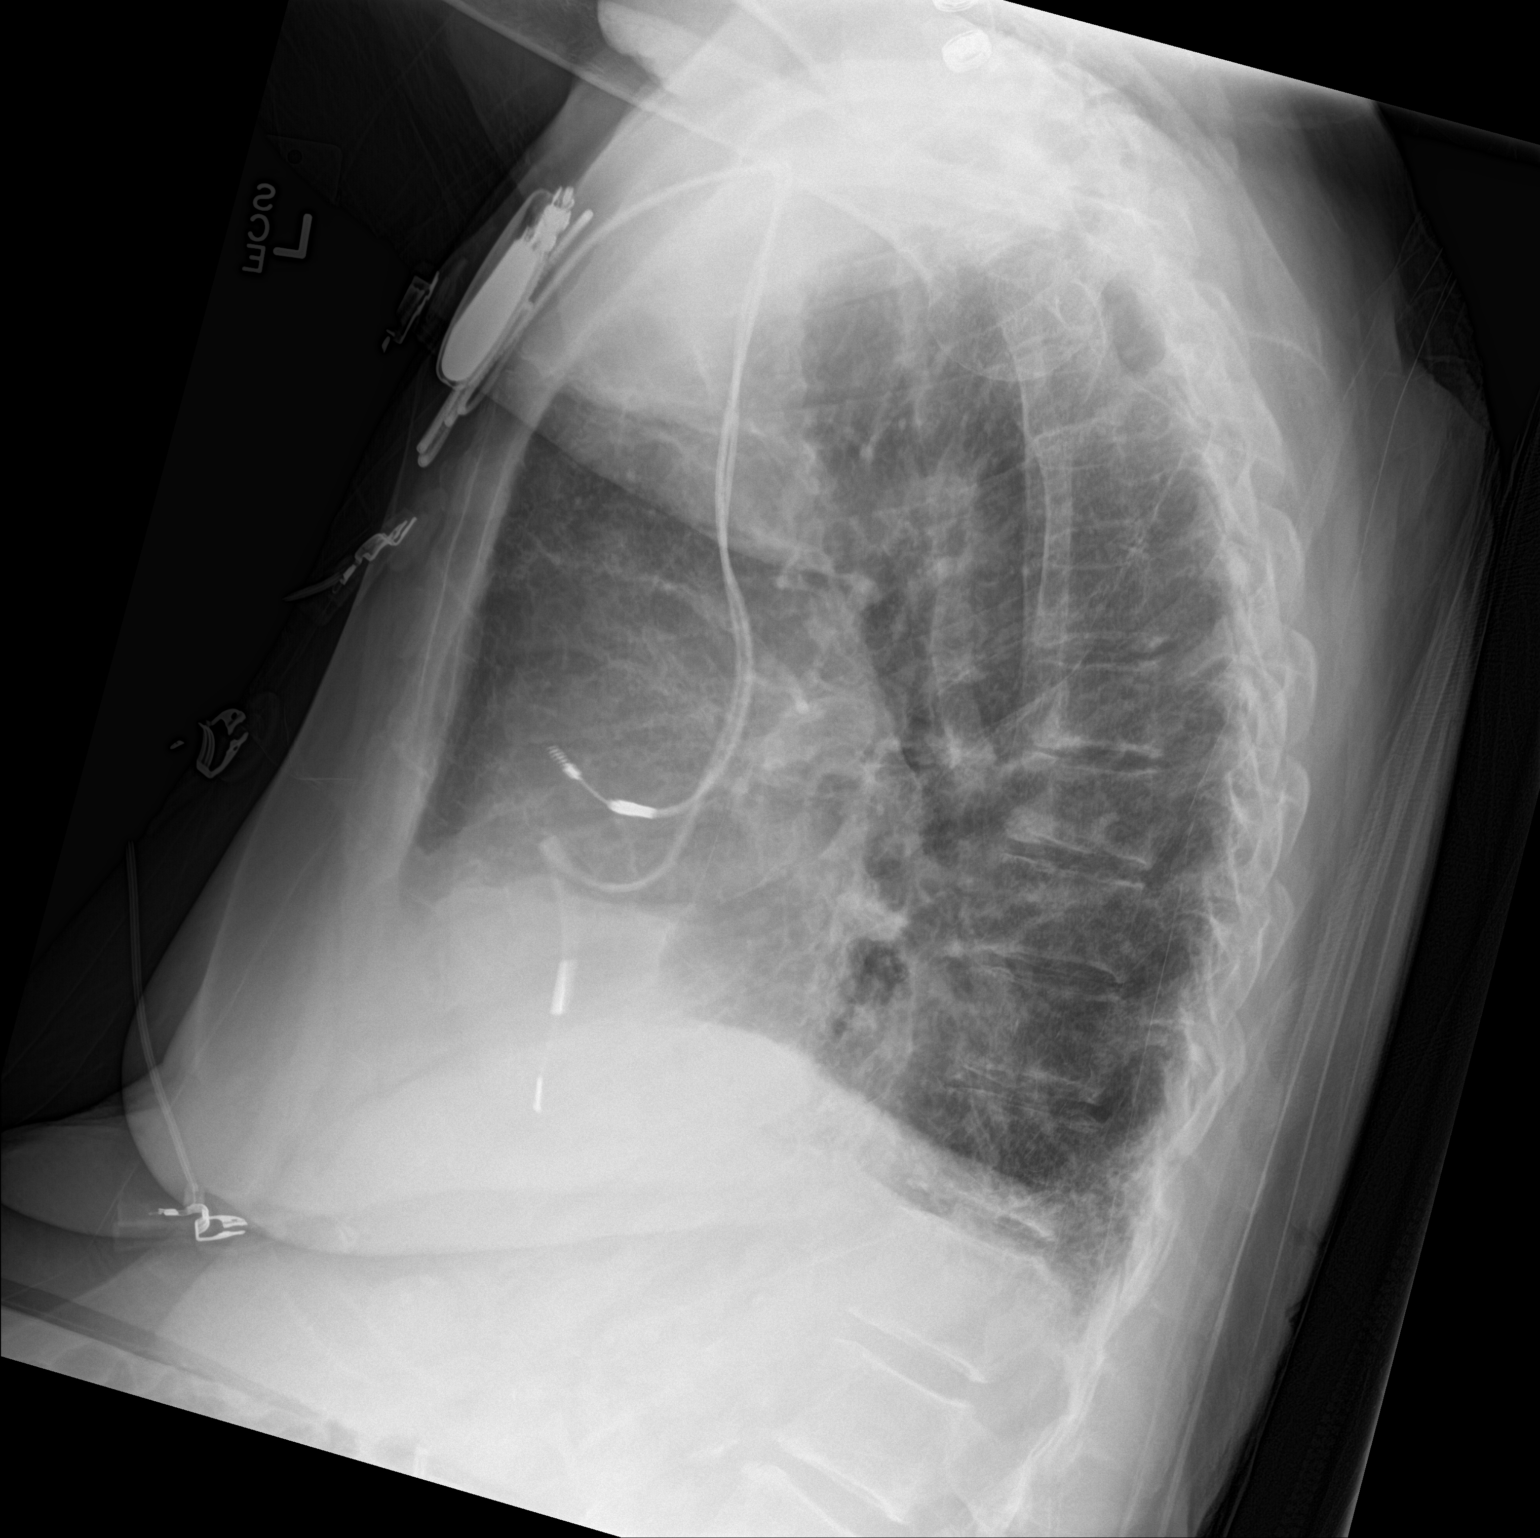
[im 2/2]
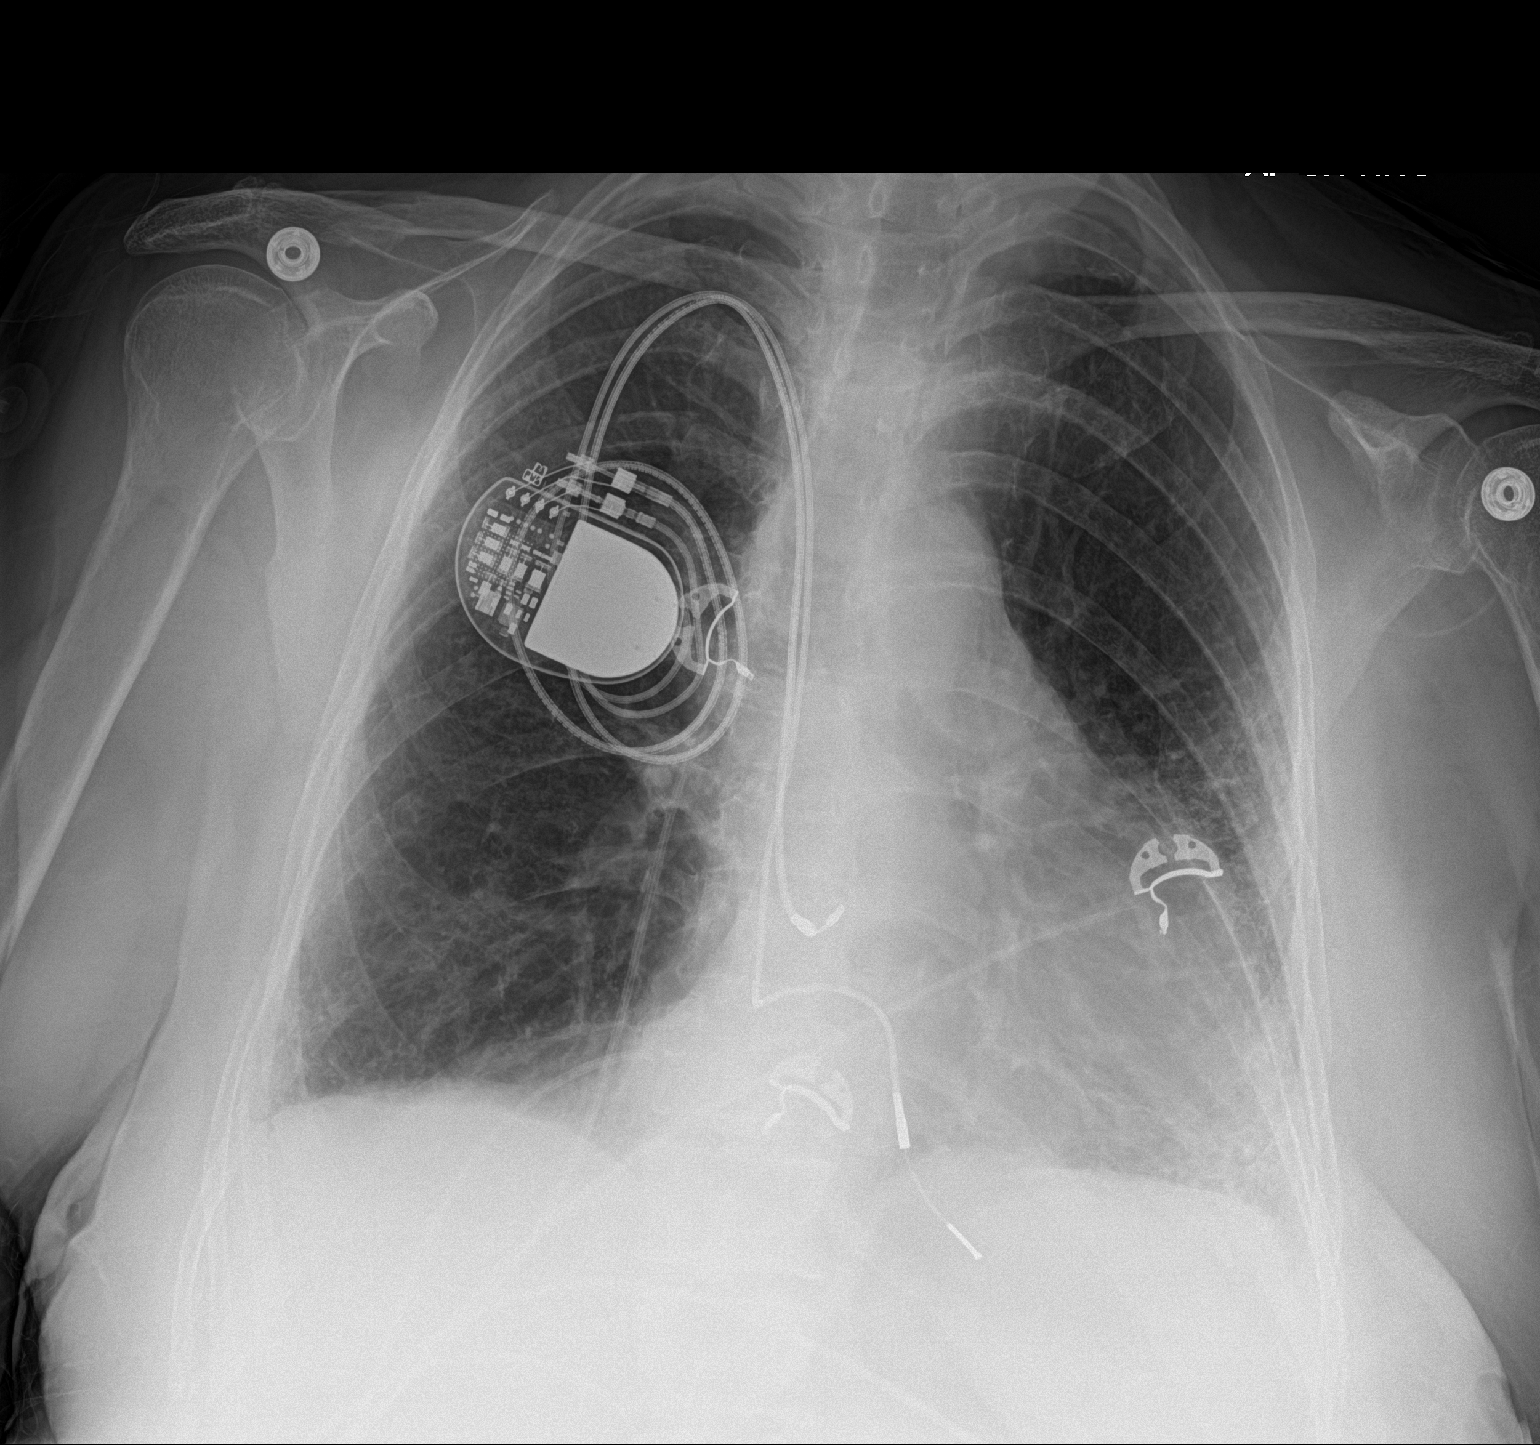

[2 of 2 positions shown; findings below may reference images not displayed]

FINDINGS: RIGHT subclavian transvenous pacemaker leads project at RIGHT atrium
and RIGHT ventricle.

Upper normal heart size.

Mediastinal contours and pulmonary vascularity normal.

Lungs slightly hyperinflated with interstitial infiltrates at the
lateral aspect of the mid to lower LEFT lung and minimally at the
lateral RIGHT lung base.

These could represent acute interstitial infection or edema or be
related to chronic interstitial lung disease/fibrosis.

Basilar atelectasis on previous exam improved.

No definite pleural effusion or pneumothorax.

Bones demineralized.
IMPRESSION: Mild interstitial infiltrates at the periphery of the lower lungs
greater on LEFT, question acute interstitial infection versus edema
or chronic interstitial disease/fibrosis.

Improved bibasilar atelectasis since prior study.

## 2018-05-28 IMAGING — CR DG CHEST 2V
1 series · 2 of 2 positions shown · non-contrast
Comparison: 01/19/2015

CLINICAL DATA: Evaluation for sepsis. Patient states she has had a
slight cough recently. Patient has a HX of CAD.

EXAM:
CHEST  2 VIEW

[Series 1: dg chest 2 view · 0.14mm/px · 2 of 2 slices shown]
[im 1/2]
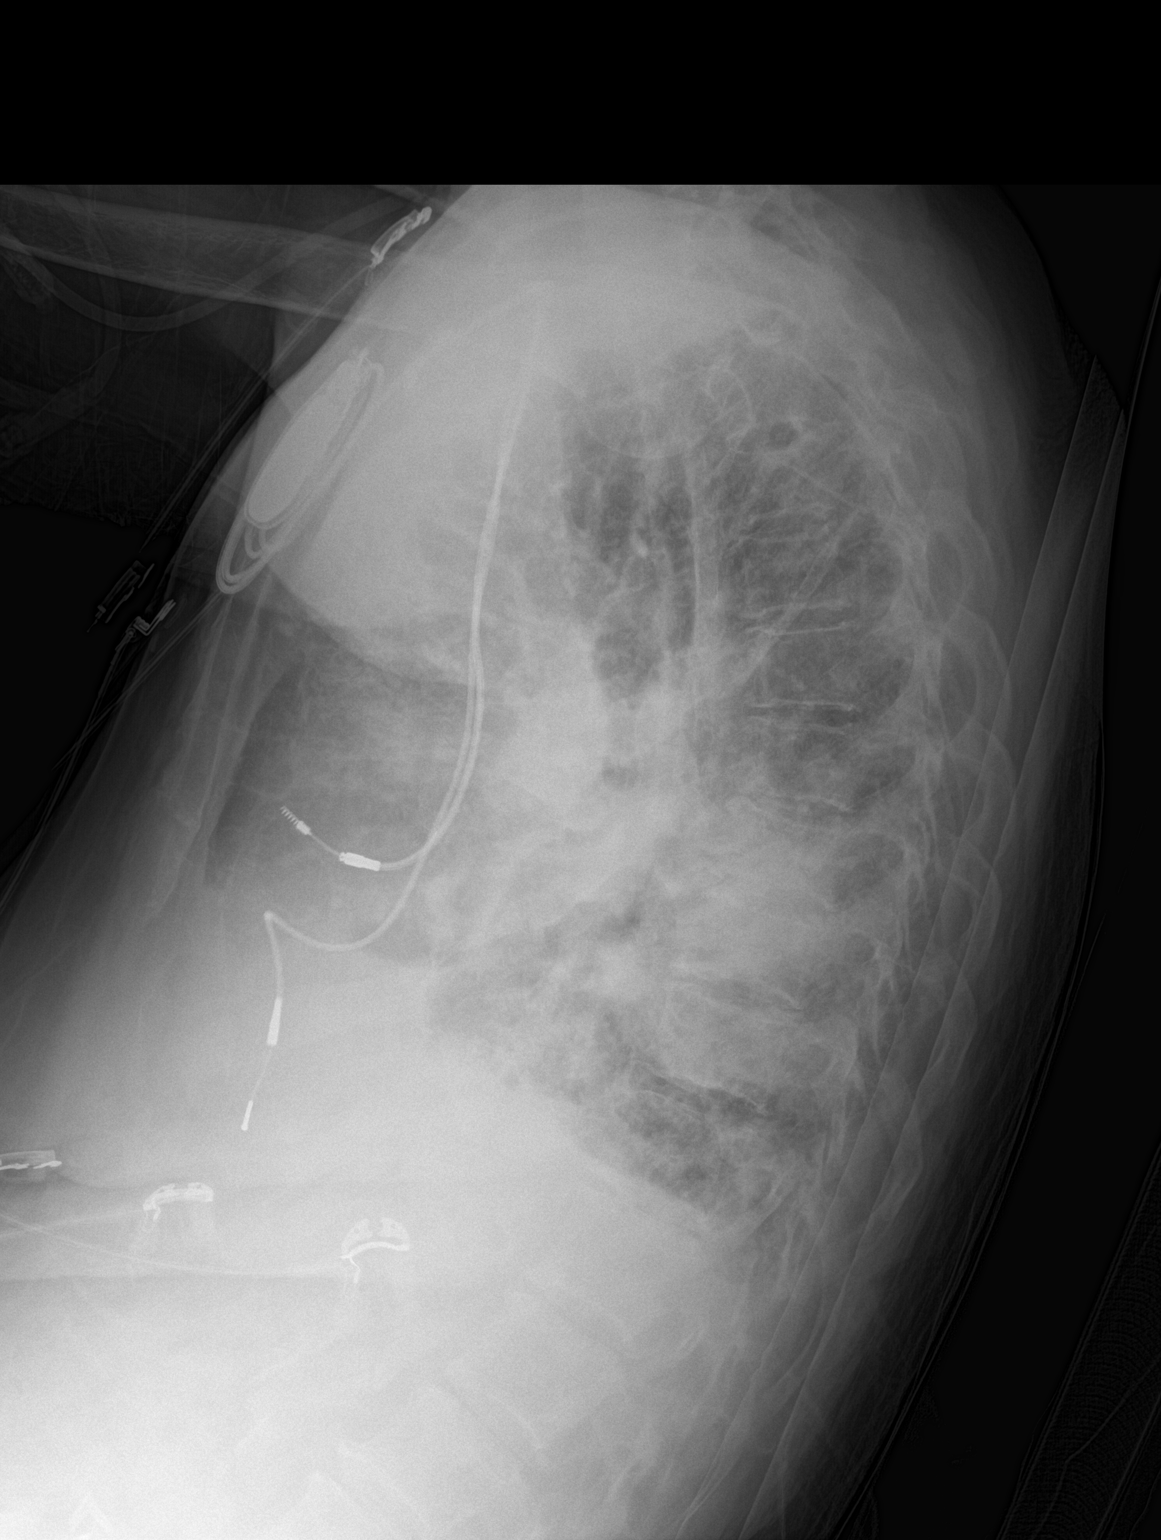
[im 2/2]
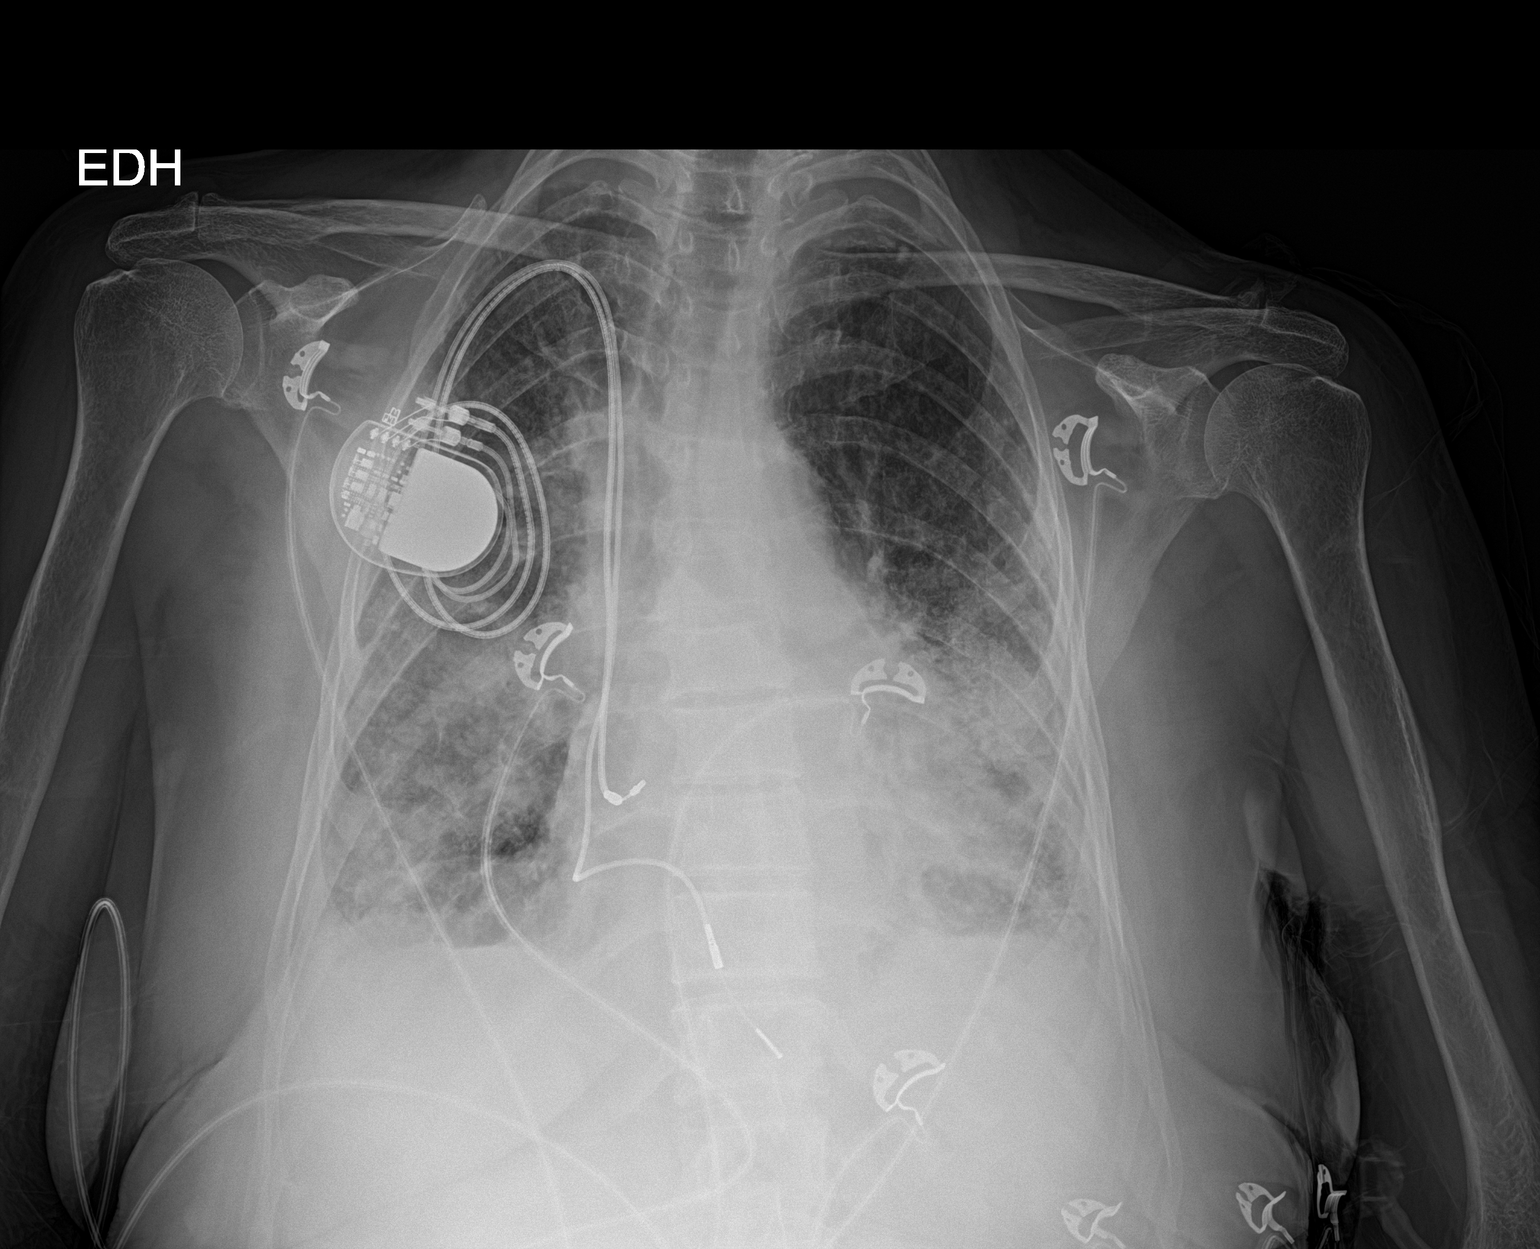

[2 of 2 positions shown; findings below may reference images not displayed]

FINDINGS: Right-sided transvenous pacemaker leads to the right atrium and
right ventricle. The heart is enlarged. There are patchy airspace
filling opacities bilaterally. Bilateral pleural effusions are
present.
IMPRESSION: 1. Cardiomegaly.
2. Bilateral airspace filling opacities are consistent with
infectious process and/or edema .

## 2018-05-29 IMAGING — DX DG CHEST 1V PORT
1 series · 1 of 1 positions shown · non-contrast
Comparison: 05/09/2016.

CLINICAL DATA: Respiratory failure.

EXAM:
PORTABLE CHEST 1 VIEW

[chest ap]
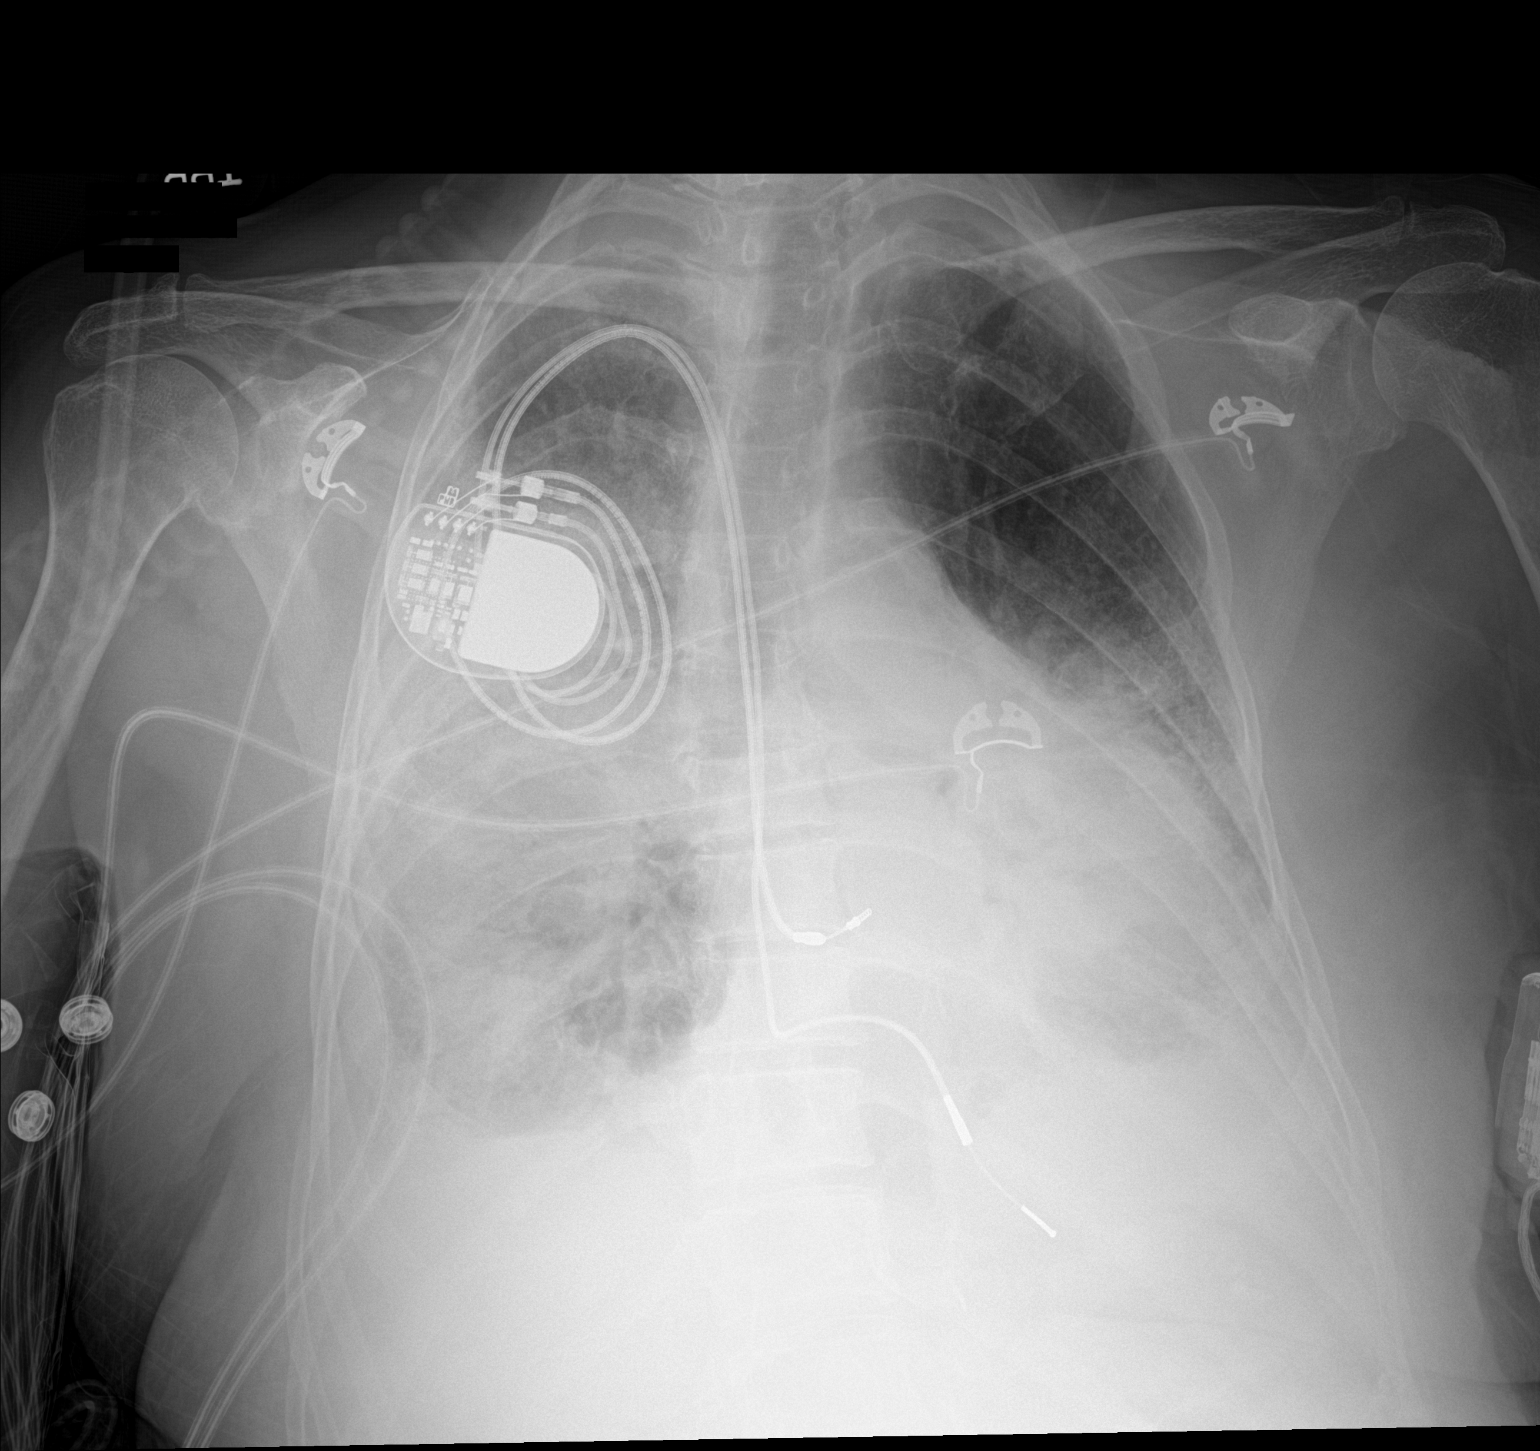

[1 of 1 positions shown; findings below may reference images not displayed]

FINDINGS: Cardiac pacer noted in stable position. Stable cardiomegaly.
Progressive bilateral pulmonary infiltrates. Bilateral pleural
effusions also again noted. Apical pleural thickening again noted
most likely secondary scarring . No pneumothorax.
IMPRESSION: 1. Progressive bilateral pulmonary infiltrates/ edema. Progression
of bilateral pleural effusions also noted.

2. Cardiac pacer stable position.  Stable cardiomegaly .
